# Patient Record
Sex: Female | Born: 1955 | Race: White | Hispanic: No | Marital: Married | Smoking: Current every day smoker
Health system: Southern US, Community
[De-identification: ages and names within clinical notes are randomized; demographics above are authoritative.]

## PROBLEM LIST (undated history)

## (undated) DIAGNOSIS — F419 Anxiety disorder, unspecified: Secondary | ICD-10-CM

## (undated) DIAGNOSIS — F329 Major depressive disorder, single episode, unspecified: Secondary | ICD-10-CM

## (undated) DIAGNOSIS — K219 Gastro-esophageal reflux disease without esophagitis: Secondary | ICD-10-CM

## (undated) DIAGNOSIS — F32A Depression, unspecified: Secondary | ICD-10-CM

## (undated) HISTORY — PX: LEG SURGERY: SHX1003

## (undated) HISTORY — PX: TUBAL LIGATION: SHX77

---

## 2000-10-28 ENCOUNTER — Other Ambulatory Visit: Admission: RE | Admit: 2000-10-28 | Discharge: 2000-10-28 | Payer: Self-pay | Admitting: Family Medicine

## 2004-06-27 ENCOUNTER — Ambulatory Visit: Payer: Self-pay | Admitting: Internal Medicine

## 2004-07-09 ENCOUNTER — Ambulatory Visit: Payer: Self-pay

## 2004-07-24 ENCOUNTER — Emergency Department: Payer: Self-pay | Admitting: Unknown Physician Specialty

## 2004-07-25 ENCOUNTER — Ambulatory Visit: Payer: Self-pay | Admitting: Gastroenterology

## 2004-07-28 ENCOUNTER — Emergency Department: Payer: Self-pay | Admitting: Emergency Medicine

## 2004-07-28 ENCOUNTER — Other Ambulatory Visit: Payer: Self-pay

## 2004-07-31 ENCOUNTER — Ambulatory Visit: Payer: Self-pay | Admitting: Gastroenterology

## 2004-08-07 ENCOUNTER — Ambulatory Visit: Payer: Self-pay

## 2006-01-05 ENCOUNTER — Emergency Department: Payer: Self-pay | Admitting: Internal Medicine

## 2007-02-18 ENCOUNTER — Emergency Department: Payer: Self-pay

## 2007-07-28 ENCOUNTER — Emergency Department: Payer: Self-pay | Admitting: Emergency Medicine

## 2007-08-03 ENCOUNTER — Ambulatory Visit: Payer: Self-pay

## 2007-08-10 ENCOUNTER — Ambulatory Visit: Payer: Self-pay | Admitting: Family Medicine

## 2007-09-08 ENCOUNTER — Emergency Department: Payer: Self-pay | Admitting: Emergency Medicine

## 2009-11-25 ENCOUNTER — Emergency Department: Payer: Self-pay | Admitting: Emergency Medicine

## 2011-07-17 ENCOUNTER — Emergency Department: Payer: Self-pay | Admitting: Emergency Medicine

## 2011-09-15 ENCOUNTER — Emergency Department: Payer: Self-pay | Admitting: *Deleted

## 2011-09-16 LAB — BASIC METABOLIC PANEL
Anion Gap: 10 (ref 7–16)
BUN: 10 mg/dL (ref 7–18)
Calcium, Total: 8.7 mg/dL (ref 8.5–10.1)
Chloride: 105 mmol/L (ref 98–107)
Co2: 24 mmol/L (ref 21–32)
Creatinine: 0.72 mg/dL (ref 0.60–1.30)
EGFR (African American): >60
EGFR (Non-African Amer.): >60
Glucose: 135 mg/dL — ABNORMAL HIGH (ref 65–99)
Osmolality: 279 (ref 275–301)
Potassium: 3.7 mmol/L (ref 3.5–5.1)

## 2011-09-16 LAB — URINALYSIS, COMPLETE
Bacteria: NONE SEEN
Glucose,UR: NEGATIVE mg/dL (ref 0–75)
Ketone: NEGATIVE
Leukocyte Esterase: NEGATIVE
Specific Gravity: 1.003 (ref 1.003–1.030)
Squamous Epithelial: NONE SEEN
WBC UR: <1 /HPF (ref 0–5)

## 2011-09-16 LAB — CBC
HCT: 34.3 % — ABNORMAL LOW (ref 35.0–47.0)
HGB: 11.5 g/dL — ABNORMAL LOW (ref 12.0–16.0)
MCH: 30.3 pg (ref 26.0–34.0)
MCHC: 33.6 g/dL (ref 32.0–36.0)
RDW: 12.6 % (ref 11.5–14.5)
WBC: 10.4 10*3/uL (ref 3.6–11.0)

## 2013-08-13 ENCOUNTER — Emergency Department: Payer: Self-pay | Admitting: Internal Medicine

## 2013-08-13 LAB — COMPREHENSIVE METABOLIC PANEL
ANION GAP: 2 — AB (ref 7–16)
AST: 22 U/L (ref 15–37)
Albumin: 4.6 g/dL (ref 3.4–5.0)
Alkaline Phosphatase: 71 U/L
BUN: 15 mg/dL (ref 7–18)
Bilirubin,Total: 0.4 mg/dL (ref 0.2–1.0)
CO2: 26 mmol/L (ref 21–32)
Calcium, Total: 9.2 mg/dL (ref 8.5–10.1)
Chloride: 111 mmol/L — ABNORMAL HIGH (ref 98–107)
Creatinine: 0.77 mg/dL (ref 0.60–1.30)
GLUCOSE: 86 mg/dL (ref 65–99)
Osmolality: 278 (ref 275–301)
POTASSIUM: 3.9 mmol/L (ref 3.5–5.1)
SGPT (ALT): 20 U/L (ref 12–78)
Sodium: 139 mmol/L (ref 136–145)
TOTAL PROTEIN: 7.8 g/dL (ref 6.4–8.2)

## 2013-08-13 LAB — URINALYSIS, COMPLETE
Bacteria: NONE SEEN
Bilirubin,UR: NEGATIVE
Blood: NEGATIVE
Glucose,UR: NEGATIVE mg/dL (ref 0–75)
KETONE: NEGATIVE
Leukocyte Esterase: NEGATIVE
NITRITE: NEGATIVE
PROTEIN: NEGATIVE
Ph: 6 (ref 4.5–8.0)
Specific Gravity: 1.005 (ref 1.003–1.030)
WBC UR: 1 /HPF (ref 0–5)

## 2013-08-13 LAB — CBC
HCT: 41.8 % (ref 35.0–47.0)
HGB: 13.2 g/dL (ref 12.0–16.0)
MCH: 29.1 pg (ref 26.0–34.0)
MCHC: 31.5 g/dL — ABNORMAL LOW (ref 32.0–36.0)
MCV: 93 fL (ref 80–100)
PLATELETS: 192 10*3/uL (ref 150–440)
RBC: 4.52 10*6/uL (ref 3.80–5.20)
RDW: 12.9 % (ref 11.5–14.5)
WBC: 6.7 10*3/uL (ref 3.6–11.0)

## 2013-08-13 LAB — TROPONIN I: Troponin-I: <0.02 ng/mL

## 2013-08-24 ENCOUNTER — Emergency Department: Payer: Self-pay | Admitting: Emergency Medicine

## 2013-08-24 LAB — CBC WITH DIFFERENTIAL/PLATELET
Basophil #: 0.1 10*3/uL (ref 0.0–0.1)
Basophil %: 0.8 %
EOS PCT: 1.6 %
Eosinophil #: 0.1 10*3/uL (ref 0.0–0.7)
HCT: 41.5 % (ref 35.0–47.0)
HGB: 13.8 g/dL (ref 12.0–16.0)
LYMPHS ABS: 2.2 10*3/uL (ref 1.0–3.6)
LYMPHS PCT: 27 %
MCH: 30.6 pg (ref 26.0–34.0)
MCHC: 33.3 g/dL (ref 32.0–36.0)
MCV: 92 fL (ref 80–100)
MONO ABS: 0.4 x10 3/mm (ref 0.2–0.9)
Monocyte %: 5.4 %
NEUTROS ABS: 5.2 10*3/uL (ref 1.4–6.5)
Neutrophil %: 65.2 %
Platelet: 209 10*3/uL (ref 150–440)
RBC: 4.51 10*6/uL (ref 3.80–5.20)
RDW: 12.7 % (ref 11.5–14.5)
WBC: 8 10*3/uL (ref 3.6–11.0)

## 2013-08-24 LAB — COMPREHENSIVE METABOLIC PANEL
ALT: 18 U/L (ref 12–78)
ANION GAP: 5 — AB (ref 7–16)
AST: 13 U/L — AB (ref 15–37)
Albumin: 4.2 g/dL (ref 3.4–5.0)
Alkaline Phosphatase: 75 U/L
BUN: 16 mg/dL (ref 7–18)
Bilirubin,Total: 0.4 mg/dL (ref 0.2–1.0)
CHLORIDE: 106 mmol/L (ref 98–107)
Calcium, Total: 9.7 mg/dL (ref 8.5–10.1)
Co2: 29 mmol/L (ref 21–32)
Creatinine: 0.78 mg/dL (ref 0.60–1.30)
EGFR (African American): >60
Glucose: 113 mg/dL — ABNORMAL HIGH (ref 65–99)
OSMOLALITY: 281 (ref 275–301)
Potassium: 4.6 mmol/L (ref 3.5–5.1)
Sodium: 140 mmol/L (ref 136–145)
Total Protein: 7.6 g/dL (ref 6.4–8.2)

## 2013-08-24 LAB — URINALYSIS, COMPLETE
BACTERIA: NONE SEEN
BILIRUBIN, UR: NEGATIVE
BLOOD: NEGATIVE
Glucose,UR: NEGATIVE mg/dL (ref 0–75)
KETONE: NEGATIVE
LEUKOCYTE ESTERASE: NEGATIVE
Nitrite: NEGATIVE
Ph: 7 (ref 4.5–8.0)
Protein: NEGATIVE
RBC,UR: NONE SEEN /HPF (ref 0–5)
SPECIFIC GRAVITY: 1.01 (ref 1.003–1.030)
WBC UR: NONE SEEN /HPF (ref 0–5)

## 2014-11-27 ENCOUNTER — Ambulatory Visit
Admission: RE | Admit: 2014-11-27 | Discharge: 2014-11-27 | Disposition: A | Discharge status: Home / Self Care | Payer: Self-pay | Source: Ambulatory Visit | Attending: Oncology | Admitting: Oncology

## 2014-11-27 ENCOUNTER — Ambulatory Visit: Payer: Self-pay | Attending: Oncology

## 2014-11-27 VITALS — BP 95/60 | HR 62 | Temp 97.8°F | Resp 18 | Ht 61.02 in | Wt 108.1 lb

## 2014-11-27 DIAGNOSIS — Z0000 Encounter for general adult medical examination without abnormal findings: Secondary | ICD-10-CM

## 2014-11-27 NOTE — Progress Notes (Signed)
Subjective:     Patient ID: Jillian Greene, female   DOB: 03/28/1956, 59 y.o.   MRN: [id number]  HPI   Review of Systems     Objective:   Physical Exam  Pulmonary/Chest: Right breast exhibits no inverted nipple, no mass, no nipple discharge, no skin change and no tenderness. Left breast exhibits no inverted nipple, no mass, no nipple discharge, no skin change and no tenderness. Breasts are symmetrical.       Assessment:      59 year old patient presents for [facility] clinic visit.   Patient screened, and meets B[facility] eligibility.  Patient does not have insurance, Medicare or Medicaid.  Handout given on Affordable Care Act. Patient is unemployed. Lives with daughter and helps take care of two grandchildren.  CBE unremarkable.  Instructed patient on breast self-exam using teach back method.  Plan:     Sent for bilateral screening mammogram.

## 2014-12-14 NOTE — Progress Notes (Signed)
Letter mailed from [facility] to notify of normal mammogram results.  Patient to return in one year for annual screening.  Copy to [facility]IS. 

## 2016-02-25 ENCOUNTER — Other Ambulatory Visit: Payer: Self-pay | Admitting: *Deleted

## 2016-02-25 ENCOUNTER — Encounter: Payer: Self-pay | Admitting: *Deleted

## 2016-02-25 ENCOUNTER — Ambulatory Visit: Payer: Self-pay | Attending: Oncology | Admitting: *Deleted

## 2016-02-25 ENCOUNTER — Ambulatory Visit
Admission: RE | Admit: 2016-02-25 | Discharge: 2016-02-25 | Disposition: A | Discharge status: Home / Self Care | Payer: Self-pay | Source: Ambulatory Visit | Attending: Oncology | Admitting: Oncology

## 2016-02-25 VITALS — BP 102/66 | HR 63 | Temp 98.3°F | Ht 61.42 in | Wt 116.5 lb

## 2016-02-25 DIAGNOSIS — N6489 Other specified disorders of breast: Secondary | ICD-10-CM

## 2016-02-25 DIAGNOSIS — Z0000 Encounter for general adult medical examination without abnormal findings: Secondary | ICD-10-CM

## 2016-02-25 NOTE — Patient Instructions (Signed)
Gave patient hand-out, Women Staying Healthy, Active and Well from [facility], with education on breast health, pap smears, heart and colon health. 

## 2016-02-25 NOTE — Progress Notes (Signed)
Subjective:     Patient ID: Jillian Greene, female   DOB: [birth year], 60 y.o.   MRN: [id number]  HPI   Review of Systems     Objective:   Physical Exam  Pulmonary/Chest: Right breast exhibits no inverted nipple, no mass, no nipple discharge, no skin change and no tenderness. Left breast exhibits no inverted nipple, no mass, no nipple discharge, no skin change and no tenderness. Breasts are symmetrical.         Assessment:     60 year old White female returns to [facility] for annual screening.  Clinical breast exam unremarkable.  Taught self breast awareness.  Patient has been screened for eligibility.  She does not have any insurance, Medicare or Medicaid.  She also meets financial eligibility.  Hand-out given on the Affordable Care Act.    Plan:     Screening mammogram ordered.  Will follow-up per protocol.

## 2016-03-13 ENCOUNTER — Ambulatory Visit
Admission: RE | Admit: 2016-03-13 | Discharge: 2016-03-13 | Disposition: A | Discharge status: Home / Self Care | Payer: Self-pay | Source: Ambulatory Visit | Attending: Oncology | Admitting: Oncology

## 2016-03-13 DIAGNOSIS — N6489 Other specified disorders of breast: Secondary | ICD-10-CM

## 2016-03-17 ENCOUNTER — Encounter: Payer: Self-pay | Admitting: *Deleted

## 2016-03-17 NOTE — Progress Notes (Signed)
Letter mailed from [facility] to inform patient of her normal mammogram results.  Patient is to follow-up with annual screening in one year.  [facility] to [facility]. 

## 2017-09-09 ENCOUNTER — Encounter: Payer: Self-pay | Admitting: Emergency Medicine

## 2017-09-09 ENCOUNTER — Emergency Department: Payer: Self-pay

## 2017-09-09 ENCOUNTER — Other Ambulatory Visit: Payer: Self-pay

## 2017-09-09 ENCOUNTER — Emergency Department
Admission: EM | Admit: 2017-09-09 | Discharge: 2017-09-09 | Disposition: A | Discharge status: Home / Self Care | Payer: Self-pay | Attending: Emergency Medicine | Admitting: Emergency Medicine

## 2017-09-09 DIAGNOSIS — Y929 Unspecified place or not applicable: Secondary | ICD-10-CM | POA: Insufficient documentation

## 2017-09-09 DIAGNOSIS — R51 Headache: Secondary | ICD-10-CM | POA: Insufficient documentation

## 2017-09-09 DIAGNOSIS — F0781 Postconcussional syndrome: Secondary | ICD-10-CM | POA: Insufficient documentation

## 2017-09-09 DIAGNOSIS — W1830XA Fall on same level, unspecified, initial encounter: Secondary | ICD-10-CM | POA: Insufficient documentation

## 2017-09-09 DIAGNOSIS — Y939 Activity, unspecified: Secondary | ICD-10-CM | POA: Insufficient documentation

## 2017-09-09 DIAGNOSIS — Y999 Unspecified external cause status: Secondary | ICD-10-CM | POA: Insufficient documentation

## 2017-09-09 DIAGNOSIS — F17210 Nicotine dependence, cigarettes, uncomplicated: Secondary | ICD-10-CM | POA: Insufficient documentation

## 2017-09-09 LAB — URINALYSIS, COMPLETE (UACMP) WITH MICROSCOPIC
BILIRUBIN URINE: NEGATIVE
Glucose, UA: NEGATIVE mg/dL
HGB URINE DIPSTICK: NEGATIVE
KETONES UR: NEGATIVE mg/dL
LEUKOCYTES UA: NEGATIVE
NITRITE: NEGATIVE
PROTEIN: NEGATIVE mg/dL
Specific Gravity, Urine: 1.005 (ref 1.005–1.030)
pH: 6 (ref 5.0–8.0)

## 2017-09-09 LAB — CBC
HCT: 39.2 % (ref 35.0–47.0)
HEMOGLOBIN: 13.6 g/dL (ref 12.0–16.0)
MCH: 31.4 pg (ref 26.0–34.0)
MCHC: 34.6 g/dL (ref 32.0–36.0)
MCV: 90.7 fL (ref 80.0–100.0)
Platelets: 215 10*3/uL (ref 150–440)
RBC: 4.33 MIL/uL (ref 3.80–5.20)
RDW: 13.4 % (ref 11.5–14.5)
WBC: 7.2 10*3/uL (ref 3.6–11.0)

## 2017-09-09 LAB — BASIC METABOLIC PANEL
ANION GAP: 6 (ref 5–15)
BUN: 18 mg/dL (ref 6–20)
CHLORIDE: 106 mmol/L (ref 101–111)
CO2: 28 mmol/L (ref 22–32)
Calcium: 9.3 mg/dL (ref 8.9–10.3)
Creatinine, Ser: 0.85 mg/dL (ref 0.44–1.00)
GFR calc Af Amer: >60 mL/min (ref >60)
GFR calc non Af Amer: >60 mL/min (ref >60)
GLUCOSE: 88 mg/dL (ref 65–99)
POTASSIUM: 4.2 mmol/L (ref 3.5–5.1)
Sodium: 140 mmol/L (ref 135–145)

## 2017-09-09 LAB — TROPONIN I: Troponin I: <0.03 ng/mL (ref <0.03)

## 2017-09-09 MED ORDER — MECLIZINE HCL 25 MG PO TABS
25.0000 mg | ORAL_TABLET | Freq: Once | ORAL | Status: AC
  Administered 2017-09-09: 25 mg via ORAL
  Filled 2017-09-09: qty 1

## 2017-09-09 MED ORDER — MECLIZINE HCL 25 MG PO TABS: 25.0000 mg | ORAL_TABLET | Freq: Three times a day (TID) | ORAL | 0 refills | Status: DC | PRN

## 2017-09-09 MED ORDER — ONDANSETRON HCL 4 MG PO TABS: 4.0000 mg | ORAL_TABLET | Freq: Three times a day (TID) | ORAL | 0 refills | Status: DC | PRN

## 2017-09-09 NOTE — ED Triage Notes (Addendum)
Pt to ED via POV with c/o head pain and dizziness xfew days. States she had mechanical fall x3wks ago and since has been having worsening symptoms. PT denies any anticoag use. PT A&OX4, speech clear. vss

## 2017-09-09 NOTE — ED Notes (Signed)
Pt states that she fell about 3 weeks ago on freshly mopped kitchen floor. Hit left side of head and eye was swollen and black. Past few days she has been feeling dizzy and lightheaded. Also reports HA. Husband at bedside. Pt alert and oriented x 4.

## 2017-09-09 NOTE — ED Provider Notes (Signed)
[facility] Emergency Department Provider Note  ____________________________________________   I have reviewed the triage vital signs and the nursing notes. Where available I have reviewed prior notes and, if possible and indicated, outside hospital notes.    HISTORY  Chief Complaint Fall    HPI Jillian Greene Current is a 62 y.o. female who is not on blood thinners.  She fell 3 weeks ago hit her head pretty hard she states she did not pass out but she has had admitted mild headaches and intermittent episodes of dizziness difficulty focusing since the fall.  She had bruising above the left eye which is gone.  She states that she has no trouble with vision no focal neurologic deficit no severe headaches, but she states that she does feel lightheaded and occasionally has true vertigo.  All of her symptoms started exactly with the fall.  She did not fall for any reasons related syncope.  She has no chest pain shortness of breath or other complaints.   History reviewed. No pertinent past medical history.  There are no active problems to display for this patient.   History reviewed. No pertinent surgical history.  Prior to Admission medications   Not on File    Allergies Patient has no known allergies.  Family History  Problem Relation Age of Onset  . Breast cancer Maternal Aunt     Social History Social History   Tobacco Use  . Smoking status: Current Every Day Smoker    Packs/day: 0.50    Types: Cigarettes  . Smokeless tobacco: Never Used  Substance Use Topics  . Alcohol use: Never    Frequency: Never  . Drug use: Never    Review of SystemsConstitutional: No fever/chills Eyes: No visual changes. ENT: No sore throat. No stiff neck no neck pain Cardiovascular: Denies chest pain. Respiratory: Denies shortness of breath. Gastrointestinal:   no vomiting.  No diarrhea.  No constipation. Genitourinary: Negative for dysuria. Musculoskeletal: Negative  lower extremity swelling Skin: Negative for rash. Neurological: Negative for severe headaches, focal weakness or numbness.   ____________________________________________   PHYSICAL EXAM:  VITAL SIGNS: ED Triage Vitals  Enc Vitals Group     BP 09/09/17 1843 132/76     Pulse Rate 09/09/17 1843 74     Resp 09/09/17 1843 18     Temp 09/09/17 1843 99.1 F (37.3 C)     Temp Source 09/09/17 1843 Oral     SpO2 09/09/17 1843 99 %     Weight 09/09/17 1843 116 lb (52.6 kg)     Height 09/09/17 1843 5\' 2"  (1.575 m)     Head Circumference --      Peak Flow --      Pain Score 09/09/17 1849 3     Pain Loc --      Pain Edu? --      Excl. in [place]? --     Constitutional: Alert and oriented. Well appearing and in no acute distress. Eyes: Conjunctivae are normal Head: Atraumatic HEENT: No congestion/rhinnorhea. Mucous membranes are moist.  Oropharynx non-erythematous Neck:   Nontender with no meningismus, no masses, no stridor Cardiovascular: Normal rate, regular rhythm. Grossly normal heart sounds.  Good peripheral circulation. Respiratory: Normal respiratory effort.  No retractions. Lungs CTAB. Abdominal: Soft and nontender. No distention. No guarding no rebound Back:  There is no focal tenderness or step off.  there is no midline tenderness there are no lesions noted. there is no CVA tenderness Musculoskeletal: No lower extremity  tenderness, no upper extremity tenderness. No joint effusions, no DVT signs strong distal pulses no edema Neurologic: Cranial nerves II through XII are grossly intact 5 out of 5 strength bilateral upper and lower extremity. Finger to nose within normal limits heel to shin within normal limits, speech is normal with no word finding difficulty or dysarthria, reflexes symmetric, pupils are equally round and reactive to light, there is no pronator drift, sensation is normal, vision is intact to confrontation, gait is deferred, there is no nystagmus, normal neurologic  exam Skin:  Skin is warm, dry and intact. No rash noted. Psychiatric: Mood and affect are normal. Speech and behavior are normal.  ____________________________________________   LABS (all labs ordered are listed, but only abnormal results are displayed)  Labs Reviewed  URINALYSIS, COMPLETE (UACMP) WITH MICROSCOPIC - Abnormal; Notable for the following components:      Result Value   Color, Urine STRAW (*)    APPearance CLEAR (*)    Bacteria, UA RARE (*)    Squamous Epithelial / LPF 0-5 (*)    All other components within normal limits  BASIC METABOLIC PANEL  CBC  TROPONIN I  CBG MONITORING, ED    Pertinent labs  results that were available during my care of the patient were reviewed by me and considered in my medical decision making (see chart for details). ____________________________________________  EKG  I personally interpreted any EKGs ordered by me or triage Normal sinus rhythm rate 72 bpm no acute ST elevation or depression normal axis unremarkable EKG ____________________________________________  RADIOLOGY  Pertinent labs & imaging results that were available during my care of the patient were reviewed by me and considered in my medical decision making (see chart for details). If possible, patient and/or family made aware of any abnormal findings.  Ct Head Wo Contrast  Result Date: 09/09/2017 EXAM: CT HEAD WITHOUT CONTRAST TECHNIQUE: Contiguous axial images were obtained from the base of the skull through the vertex without intravenous contrast. COMPARISON:  None. FINDINGS: Brain: No evidence of acute infarction, hemorrhage, hydrocephalus, extra-axial collection or mass lesion/mass effect. Vascular: No hyperdense vessel or unexpected calcification. Skull: Normal. Negative for fracture or focal lesion. Sinuses/Orbits: Visualize globes and orbits are unremarkable. Visualized sinuses and mastoid air cells are clear. Other: None. IMPRESSION: Normal unenhanced CT scan of the  brain. Electronically Signed   By: Lajean Manes M.D.   On: 09/09/2017 19:31   ____________________________________________    PROCEDURES  Procedure(s) performed: None  Procedures  Critical Care performed: None  ____________________________________________   INITIAL IMPRESSION / ASSESSMENT AND PLAN / ED COURSE  Pertinent labs & imaging results that were available during my care of the patient were reviewed by me and considered in my medical decision making (see chart for details).  Here with signs and symptoms of postconcussive syndrome.  She has been having vertigo and lightheadedness, occasional mild headaches, and difficulty focusing since she fell and hit her head 3 weeks ago.  She slipped on something wet.  It was not a syncopal fall.  She has NIH stroke scale of 0 I do not think this represents CVA.  We will treat her symptomatically and advise close outpatient follow-up with neurology.    ____________________________________________   FINAL CLINICAL IMPRESSION(S) / ED DIAGNOSES  Final diagnoses:  None      This chart was dictated using voice recognition software.  Despite best efforts to proofread,  errors can occur which can change meaning.      Schuyler Amor, MD  09/09/17 2017  

## 2017-09-09 NOTE — ED Notes (Signed)
Spoke with MD Joni Fears regarding pt presentation, verbal orders for CT head given

## 2018-05-30 ENCOUNTER — Emergency Department: Payer: Self-pay

## 2018-05-30 ENCOUNTER — Emergency Department
Admission: EM | Admit: 2018-05-30 | Discharge: 2018-05-30 | Disposition: A | Discharge status: Home / Self Care | Payer: Self-pay | Attending: Emergency Medicine | Admitting: Emergency Medicine

## 2018-05-30 ENCOUNTER — Encounter: Payer: Self-pay | Admitting: Emergency Medicine

## 2018-05-30 ENCOUNTER — Other Ambulatory Visit: Payer: Self-pay

## 2018-05-30 DIAGNOSIS — Z79899 Other long term (current) drug therapy: Secondary | ICD-10-CM | POA: Insufficient documentation

## 2018-05-30 DIAGNOSIS — F329 Major depressive disorder, single episode, unspecified: Secondary | ICD-10-CM | POA: Insufficient documentation

## 2018-05-30 DIAGNOSIS — F419 Anxiety disorder, unspecified: Secondary | ICD-10-CM | POA: Insufficient documentation

## 2018-05-30 DIAGNOSIS — M5410 Radiculopathy, site unspecified: Secondary | ICD-10-CM | POA: Insufficient documentation

## 2018-05-30 DIAGNOSIS — M79602 Pain in left arm: Secondary | ICD-10-CM | POA: Insufficient documentation

## 2018-05-30 DIAGNOSIS — F17210 Nicotine dependence, cigarettes, uncomplicated: Secondary | ICD-10-CM | POA: Insufficient documentation

## 2018-05-30 HISTORY — DX: Gastro-esophageal reflux disease without esophagitis: K21.9

## 2018-05-30 HISTORY — DX: Major depressive disorder, single episode, unspecified: F32.9

## 2018-05-30 HISTORY — DX: Anxiety disorder, unspecified: F41.9

## 2018-05-30 HISTORY — DX: Depression, unspecified: F32.A

## 2018-05-30 LAB — BASIC METABOLIC PANEL
Anion gap: 7 (ref 5–15)
BUN: 17 mg/dL (ref 8–23)
CO2: 23 mmol/L (ref 22–32)
Calcium: 9 mg/dL (ref 8.9–10.3)
Chloride: 108 mmol/L (ref 98–111)
Creatinine, Ser: 0.68 mg/dL (ref 0.44–1.00)
GFR calc Af Amer: >60 mL/min (ref >60)
GFR calc non Af Amer: >60 mL/min (ref >60)
Glucose, Bld: 79 mg/dL (ref 70–99)
Potassium: 4.5 mmol/L (ref 3.5–5.1)
Sodium: 138 mmol/L (ref 135–145)

## 2018-05-30 LAB — CBC
HEMATOCRIT: 39.6 % (ref 36.0–46.0)
Hemoglobin: 12.8 g/dL (ref 12.0–15.0)
MCH: 30.3 pg (ref 26.0–34.0)
MCHC: 32.3 g/dL (ref 30.0–36.0)
MCV: 93.6 fL (ref 80.0–100.0)
NRBC: 0 % (ref 0.0–0.2)
Platelets: 240 10*3/uL (ref 150–400)
RBC: 4.23 MIL/uL (ref 3.87–5.11)
RDW: 12.9 % (ref 11.5–15.5)
WBC: 7.4 10*3/uL (ref 4.0–10.5)

## 2018-05-30 LAB — TROPONIN I: Troponin I: <0.03 ng/mL (ref <0.03)

## 2018-05-30 MED ORDER — NAPROXEN 500 MG PO TABS: 500.0000 mg | ORAL_TABLET | Freq: Two times a day (BID) | ORAL | 2 refills | Status: AC

## 2018-05-30 MED ORDER — PREDNISONE 50 MG PO TABS: 50.0000 mg | ORAL_TABLET | Freq: Every day | ORAL | 0 refills | Status: AC

## 2018-05-30 MED ORDER — KETOROLAC TROMETHAMINE 30 MG/ML IJ SOLN
30.0000 mg | Freq: Once | INTRAMUSCULAR | Status: AC
  Administered 2018-05-30: 30 mg via INTRAMUSCULAR
  Filled 2018-05-30: qty 1

## 2018-05-30 NOTE — ED Notes (Signed)
E-signature not working at time of D/C. Pt and family verbalized understanding of instructions with no further questions at this time. Pt in NAD at time of D/C.

## 2018-05-30 NOTE — ED Triage Notes (Signed)
Pt to ED via POV stating that she has been having left arm pain x 1 week. Pt states that today the pain seems to have gotten worse and she is now having pain in her left arm and shoulder. Pt states that this started while she was in church. Pt states that she is a little short of breath. Pt denies N/V. Pt is in NAD at this time.

## 2018-05-30 NOTE — ED Notes (Signed)
Patient transported to X-ray 

## 2018-05-30 NOTE — ED Provider Notes (Signed)
[facility] Emergency Department Provider Note   ____________________________________________    I have reviewed the triage vital signs and the nursing notes.   HISTORY  Chief Complaint Arm Pain     HPI Jillian Greene is a 62 y.o. female who presents with complaints of left arm pain.  Patient notes that the lateral aspect of her left arm has been hurting for "weeks.  She reports it is most prominent near the elbow and proximal forearm.  No erythema, no trauma to the area, no bruising.  Occasionally she has pain radiate all the way down her arm to her fingertips.  She denies numbness or tingling.  No fevers or chills.  No history of DVT.  No significant swelling.  No chest pain.  Denies neck pain he is never had this before, she has not taken anything for this.   Past Medical History:  Diagnosis Date  . Anxiety and depression   . GERD (gastroesophageal reflux disease)     There are no active problems to display for this patient.   Past Surgical History:  Procedure Laterality Date  . LEG SURGERY Right   . TUBAL LIGATION      Prior to Admission medications   Medication Sig Start Date End Date Taking? Authorizing Provider  lansoprazole (PREVACID) 30 MG capsule Take 30 mg by mouth daily at 12 noon.   Yes [provider]  sertraline (ZOLOFT) 100 MG tablet Take 100 mg by mouth at bedtime.   Yes [provider]  naproxen (NAPROSYN) 500 MG tablet Take 1 tablet (500 mg total) by mouth 2 (two) times daily with a meal. 05/30/18   Lavonia Drafts, MD  predniSONE (DELTASONE) 50 MG tablet Take 1 tablet (50 mg total) by mouth daily with breakfast. 05/30/18   Lavonia Drafts, MD     Allergies Codeine and Cyclobenzaprine  Family History  Problem Relation Age of Onset  . Breast cancer Maternal Aunt     Social History Social History   Tobacco Use  . Smoking status: Current Every Day Smoker    Packs/day: 0.50    Types: Cigarettes  .  Smokeless tobacco: Never Used  Substance Use Topics  . Alcohol use: Never    Frequency: Never  . Drug use: Never    Review of Systems  Constitutional: No fever/chills Eyes: No visual changes.  ENT: No neck pain Cardiovascular: Denies chest pain. Respiratory: Denies shortness of breath. Gastrointestinal: No abdominal pain.  No nausea, no vomiting.   Genitourinary: Negative for dysuria. Musculoskeletal: As above Skin: As above Neurological: Negative for tingling or weakness   ____________________________________________   PHYSICAL EXAM:  VITAL SIGNS: ED Triage Vitals  Enc Vitals Group     BP 05/30/18 1259 (!) 159/78     Pulse Rate 05/30/18 1259 (!) 54     Resp 05/30/18 1259 16     Temp 05/30/18 1259 98.4 F (36.9 C)     Temp src --      SpO2 05/30/18 1259 100 %     Weight 05/30/18 1301 54.4 kg (120 lb)     Height 05/30/18 1301 1.575 m (5\' 2" )     Head Circumference --      Peak Flow --      Pain Score 05/30/18 1301 7     Pain Loc --      Pain Edu? --      Excl. in [place]? --     Constitutional: Alert and oriented.  Eyes: Conjunctivae are normal.   Mouth/Throat: Mucous membranes are moist.   Neck:  Painless ROM, no vertebral tenderness to palpation Cardiovascular: Normal rate, regular rhythm. Grossly normal heart sounds.  Good peripheral circulation. Respiratory: Normal respiratory effort.  No retractions. Lungs CTAB.   Musculoskeletal: Left upper extremity: Overall reassuring exam, no significant swelling, no pallor, well-perfused, no bruising or evidence of trauma.  Mild tenderness overlying the proximal lateral left forearm but no palpable mass.  Warm and well perfused Neurologic:  Normal speech and language. No gross focal neurologic deficits are appreciated.  Skin:  Skin is warm, dry and intact. No rash noted. Psychiatric: Mood and affect are normal. Speech and behavior are normal.  ____________________________________________   LABS (all labs ordered are  listed, but only abnormal results are displayed)  Labs Reviewed  BASIC METABOLIC PANEL  CBC  TROPONIN I   ____________________________________________  EKG  ED ECG REPORT I, Lavonia Drafts, the attending physician, personally viewed and interpreted this ECG.  Date: 05/30/2018  Rhythm: normal sinus rhythm QRS Axis: normal Intervals: normal ST/T Wave abnormalities: normal Narrative Interpretation: no evidence of acute ischemia  ____________________________________________  RADIOLOGY  Chest x-ray demonstrates no acute illness Negative for DVT ____________________________________________   PROCEDURES  Procedure(s) performed: No  Procedures   Critical Care performed: No ____________________________________________   INITIAL IMPRESSION / ASSESSMENT AND PLAN / ED COURSE  Pertinent labs & imaging results that were available during my care of the patient were reviewed by me and considered in my medical decision making (see chart for details).  Patient presents with left upper extremity pain as described above, strongly suspect musculoskeletal pain versus possible radiculopathy.  Will treat with IM Toradol, check ultrasound to rule out blood clot anticipate outpatient supportive care  _________________________   FINAL CLINICAL IMPRESSION(S) / ED DIAGNOSES  Final diagnoses:  Left arm pain  Radiculopathy, unspecified spinal region        Note:  This document was prepared using Dragon voice recognition software and may include unintentional dictation errors.   Lavonia Drafts, MD 05/30/18 [id number]

## 2019-01-26 ENCOUNTER — Telehealth: Payer: Self-pay | Admitting: *Deleted

## 2019-01-26 DIAGNOSIS — Z87891 Personal history of nicotine dependence: Secondary | ICD-10-CM

## 2019-01-26 DIAGNOSIS — Z122 Encounter for screening for malignant neoplasm of respiratory organs: Secondary | ICD-10-CM

## 2019-01-26 NOTE — Telephone Encounter (Signed)
Received referral for initial lung cancer screening scan. Contacted patient and obtained smoking history,(current, 45 pack year) as well as answering questions related to screening process. Patient denies signs of lung cancer such as weight loss or hemoptysis. Patient denies comorbidity that would prevent curative treatment if lung cancer were found. Patient is scheduled for shared decision making visit and CT scan on (date tbd related to patient preference).

## 2019-02-14 ENCOUNTER — Inpatient Hospital Stay: Payer: BC Managed Care – PPO | Attending: Nurse Practitioner | Admitting: Nurse Practitioner

## 2019-02-14 ENCOUNTER — Ambulatory Visit: Payer: BC Managed Care – PPO

## 2019-02-14 ENCOUNTER — Encounter: Payer: Self-pay | Admitting: Nurse Practitioner

## 2019-02-15 ENCOUNTER — Telehealth: Payer: Self-pay | Admitting: *Deleted

## 2019-02-15 NOTE — Telephone Encounter (Signed)
Received referral for low dose lung cancer screening CT scan. Message left at phone number listed in EMR for patient to call me back to facilitate scheduling scan.  

## 2019-02-28 ENCOUNTER — Ambulatory Visit: Admission: RE | Admit: 2019-02-28 | Payer: BC Managed Care – PPO | Source: Ambulatory Visit

## 2019-02-28 ENCOUNTER — Inpatient Hospital Stay (HOSPITAL_BASED_OUTPATIENT_CLINIC_OR_DEPARTMENT_OTHER): Payer: BC Managed Care – PPO | Admitting: Oncology

## 2019-02-28 DIAGNOSIS — Z87891 Personal history of nicotine dependence: Secondary | ICD-10-CM

## 2019-02-28 NOTE — Progress Notes (Signed)
Virtual Visit via Video Note  I connected with Jillian Greene on 02/28/19 at 10:30 AM EDT by a video enabled telemedicine application and verified that I am speaking with the correct person using two identifiers.  Location: Patient: Home Provider: Office   I discussed the limitations of evaluation and management by telemedicine and the availability of in person appointments. The patient expressed understanding and agreed to proceed.  I discussed the assessment and treatment plan with the patient. The patient was provided an opportunity to ask questions and all were answered. The patient agreed with the plan and demonstrated an understanding of the instructions.   The patient was advised to call back or seek an in-person evaluation if the symptoms worsen or if the condition fails to improve as anticipated.   In accordance with CMS guidelines, patient has met eligibility criteria including age, absence of signs or symptoms of lung cancer.  Social History   Tobacco Use  . Smoking status: Current Every Day Smoker    Packs/day: 1.00    Years: 45.00    Pack years: 45.00    Types: Cigarettes  . Smokeless tobacco: Never Used  . Tobacco comment: .5ppd currently  Substance Use Topics  . Alcohol use: Never    Frequency: Never  . Drug use: Never      A shared decision-making session was conducted prior to the performance of CT scan. This includes one or more decision aids, includes benefits and harms of screening, follow-up diagnostic testing, over-diagnosis, false positive rate, and total radiation exposure.   Counseling on the importance of adherence to annual lung cancer LDCT screening, impact of co-morbidities, and ability or willingness to undergo diagnosis and treatment is imperative for compliance of the program.   Counseling on the importance of continued smoking cessation for former smokers; the importance of smoking cessation for current smokers, and information about tobacco  cessation interventions have been given to patient including [place] and [facility] quit [place] programs.   Written order for lung cancer screening with LDCT has been given to the patient and any and all questions have been answered to the best of my abilities.    Yearly follow up will be coordinated by Burgess Estelle, Thoracic Navigator.  I provided 15 minutes of face-to-face video visit time during this encounter, and > 50% was spent counseling as documented under my assessment & plan.   Jacquelin Hawking, NP

## 2019-03-07 ENCOUNTER — Ambulatory Visit
Admission: RE | Admit: 2019-03-07 | Discharge: 2019-03-07 | Disposition: A | Discharge status: Home / Self Care | Payer: BC Managed Care – PPO | Source: Ambulatory Visit | Attending: Nurse Practitioner | Admitting: Nurse Practitioner

## 2019-03-07 ENCOUNTER — Other Ambulatory Visit: Payer: Self-pay

## 2019-03-07 DIAGNOSIS — Z122 Encounter for screening for malignant neoplasm of respiratory organs: Secondary | ICD-10-CM | POA: Insufficient documentation

## 2019-03-07 DIAGNOSIS — Z87891 Personal history of nicotine dependence: Secondary | ICD-10-CM | POA: Insufficient documentation

## 2019-03-09 ENCOUNTER — Encounter: Payer: Self-pay | Admitting: *Deleted

## 2019-03-14 ENCOUNTER — Other Ambulatory Visit: Payer: Self-pay | Admitting: Infectious Diseases

## 2019-03-14 DIAGNOSIS — Z1231 Encounter for screening mammogram for malignant neoplasm of breast: Secondary | ICD-10-CM

## 2019-06-06 ENCOUNTER — Ambulatory Visit
Admission: RE | Admit: 2019-06-06 | Discharge: 2019-06-06 | Disposition: A | Discharge status: Home / Self Care | Payer: BC Managed Care – PPO | Source: Ambulatory Visit | Attending: Infectious Diseases | Admitting: Infectious Diseases

## 2019-06-06 DIAGNOSIS — Z1231 Encounter for screening mammogram for malignant neoplasm of breast: Secondary | ICD-10-CM | POA: Diagnosis present

## 2020-02-24 ENCOUNTER — Telehealth: Payer: Self-pay | Admitting: *Deleted

## 2020-02-24 NOTE — Telephone Encounter (Signed)
(  02/24/2020) Pt notified that lung cancer screening imaging is due currently or in the near future. Verified smoking history (Current Smoker, 0.5 ppd ). Tentative appt on 03/12/20 @ 10:30 am SRW

## 2020-02-26 ENCOUNTER — Other Ambulatory Visit: Payer: Self-pay | Admitting: *Deleted

## 2020-02-26 DIAGNOSIS — Z87891 Personal history of nicotine dependence: Secondary | ICD-10-CM

## 2020-02-26 DIAGNOSIS — Z122 Encounter for screening for malignant neoplasm of respiratory organs: Secondary | ICD-10-CM

## 2020-02-26 NOTE — Progress Notes (Signed)
Current smoker, 45.5. pack year

## 2020-03-12 ENCOUNTER — Ambulatory Visit: Payer: BC Managed Care – PPO

## 2020-03-19 ENCOUNTER — Ambulatory Visit
Admission: RE | Admit: 2020-03-19 | Discharge: 2020-03-19 | Disposition: A | Discharge status: Home / Self Care | Payer: BC Managed Care – PPO | Source: Ambulatory Visit | Attending: Oncology | Admitting: Oncology

## 2020-03-19 ENCOUNTER — Other Ambulatory Visit: Payer: Self-pay

## 2020-03-19 DIAGNOSIS — Z122 Encounter for screening for malignant neoplasm of respiratory organs: Secondary | ICD-10-CM | POA: Diagnosis present

## 2020-03-19 DIAGNOSIS — Z87891 Personal history of nicotine dependence: Secondary | ICD-10-CM | POA: Insufficient documentation

## 2020-03-21 ENCOUNTER — Encounter: Payer: Self-pay | Admitting: *Deleted

## 2020-12-21 ENCOUNTER — Other Ambulatory Visit: Payer: Self-pay

## 2020-12-21 ENCOUNTER — Emergency Department
Admission: EM | Admit: 2020-12-21 | Discharge: 2020-12-21 | Disposition: A | Discharge status: Home / Self Care | Payer: Self-pay | Attending: Student in an Organized Health Care Education/Training Program | Admitting: Student in an Organized Health Care Education/Training Program

## 2020-12-21 ENCOUNTER — Emergency Department: Payer: Self-pay

## 2020-12-21 DIAGNOSIS — R42 Dizziness and giddiness: Secondary | ICD-10-CM | POA: Insufficient documentation

## 2020-12-21 DIAGNOSIS — R197 Diarrhea, unspecified: Secondary | ICD-10-CM | POA: Insufficient documentation

## 2020-12-21 DIAGNOSIS — R110 Nausea: Secondary | ICD-10-CM | POA: Insufficient documentation

## 2020-12-21 DIAGNOSIS — F17210 Nicotine dependence, cigarettes, uncomplicated: Secondary | ICD-10-CM | POA: Insufficient documentation

## 2020-12-21 DIAGNOSIS — Z20822 Contact with and (suspected) exposure to covid-19: Secondary | ICD-10-CM | POA: Insufficient documentation

## 2020-12-21 LAB — RESP PANEL BY RT-PCR (FLU A&B, COVID) ARPGX2
Influenza A by PCR: NEGATIVE
Influenza B by PCR: NEGATIVE
SARS Coronavirus 2 by RT PCR: NEGATIVE

## 2020-12-21 LAB — URINALYSIS, COMPLETE (UACMP) WITH MICROSCOPIC
Bacteria, UA: NONE SEEN
Bilirubin Urine: NEGATIVE
Glucose, UA: NEGATIVE mg/dL
Hgb urine dipstick: NEGATIVE
Ketones, ur: NEGATIVE mg/dL
Leukocytes,Ua: NEGATIVE
Nitrite: NEGATIVE
Protein, ur: NEGATIVE mg/dL
Specific Gravity, Urine: 1.015 (ref 1.005–1.030)
pH: 5 (ref 5.0–8.0)

## 2020-12-21 LAB — CBC
HCT: 39.4 % (ref 36.0–46.0)
Hemoglobin: 13.4 g/dL (ref 12.0–15.0)
MCH: 31.5 pg (ref 26.0–34.0)
MCHC: 34 g/dL (ref 30.0–36.0)
MCV: 92.5 fL (ref 80.0–100.0)
Platelets: 231 10*3/uL (ref 150–400)
RBC: 4.26 MIL/uL (ref 3.87–5.11)
RDW: 12.7 % (ref 11.5–15.5)
WBC: 6.3 10*3/uL (ref 4.0–10.5)
nRBC: 0 % (ref 0.0–0.2)

## 2020-12-21 LAB — TROPONIN I (HIGH SENSITIVITY): Troponin I (High Sensitivity): 3 ng/L (ref <18)

## 2020-12-21 LAB — BASIC METABOLIC PANEL
Anion gap: 7 (ref 5–15)
BUN: 18 mg/dL (ref 8–23)
CO2: 22 mmol/L (ref 22–32)
Calcium: 9.5 mg/dL (ref 8.9–10.3)
Chloride: 108 mmol/L (ref 98–111)
Creatinine, Ser: 0.85 mg/dL (ref 0.44–1.00)
GFR, Estimated: >60 mL/min (ref >60)
Glucose, Bld: 94 mg/dL (ref 70–99)
Potassium: 4.3 mmol/L (ref 3.5–5.1)
Sodium: 137 mmol/L (ref 135–145)

## 2020-12-21 LAB — HEPATIC FUNCTION PANEL
ALT: 13 U/L (ref 0–44)
AST: 14 U/L — ABNORMAL LOW (ref 15–41)
Albumin: 3.8 g/dL (ref 3.5–5.0)
Alkaline Phosphatase: 63 U/L (ref 38–126)
Bilirubin, Direct: <0.1 mg/dL (ref 0.0–0.2)
Total Bilirubin: 0.7 mg/dL (ref 0.3–1.2)
Total Protein: 6.9 g/dL (ref 6.5–8.1)

## 2020-12-21 MED ORDER — SODIUM CHLORIDE 0.9 % IV BOLUS
1000.0000 mL | Freq: Once | INTRAVENOUS | Status: AC
  Administered 2020-12-21: 1000 mL via INTRAVENOUS

## 2020-12-21 NOTE — ED Triage Notes (Addendum)
Pt c/o dizziness/lightheadedness w/ standing x2 days. Pt reports diarhea x5 yesterday and rt hand and bilateral arm pain- denies injury. Pt denies CP, SOB, n/v. Pt is AOX4, speech is clear, upper and lower extremities equally strong. Pt denies visual disturbances. Pt ambulatory.

## 2020-12-21 NOTE — ED Provider Notes (Signed)
[facility] Emergency Department Provider Note    Event Date/Time   First MD Initiated Contact with Patient 12/21/20 1436     (approximate)  I have reviewed the triage vital signs and the nursing notes.   HISTORY  Chief Complaint Dizziness    HPI Jillian Greene is a 65 y.o. female below listed past medical history presents to the ER for evaluation of to 3 days of diarrheal illness nausea decreased p.o. intake with associated dizziness that she states occurs most frequently she stands up quickly.  Denies any headache.  States that she did have some achiness in bilateral arms that is since resolved.  Had some mild intermittent chest pain denies any chest pain or discomfort at this time.  No cough or congestion.  Denies any abdominal pain.  No dysuria.  Past Medical History:  Diagnosis Date   Anxiety and depression    GERD (gastroesophageal reflux disease)    Family History  Problem Relation Age of Onset   Breast cancer Maternal Aunt    Past Surgical History:  Procedure Laterality Date   LEG SURGERY Right    TUBAL LIGATION     There are no problems to display for this patient.     Prior to Admission medications   Medication Sig Start Date End Date Taking? Authorizing Provider  lansoprazole (PREVACID) 30 MG capsule Take 30 mg by mouth daily at 12 noon.    [provider]  naproxen (NAPROSYN) 500 MG tablet Take 1 tablet (500 mg total) by mouth 2 (two) times daily with a meal. 05/30/18   Lavonia Drafts, MD  predniSONE (DELTASONE) 50 MG tablet Take 1 tablet (50 mg total) by mouth daily with breakfast. 05/30/18   Lavonia Drafts, MD  sertraline (ZOLOFT) 100 MG tablet Take 100 mg by mouth at bedtime.    [provider]    Allergies Codeine and Cyclobenzaprine    Social History Social History   Tobacco Use   Smoking status: Every Day    Packs/day: 1.00    Years: 45.00    Pack years: 45.00    Types: Cigarettes    Smokeless tobacco: Never   Tobacco comments:    .5ppd currently  Substance Use Topics   Alcohol use: Never   Drug use: Never    Review of Systems Patient denies headaches, rhinorrhea, blurry vision, numbness, shortness of breath, chest pain, edema, cough, abdominal pain, nausea, vomiting, diarrhea, dysuria, fevers, rashes or hallucinations unless otherwise stated above in HPI. ____________________________________________   PHYSICAL EXAM:  VITAL SIGNS: Vitals:   12/21/20 1345  BP: 118/72  Pulse: 63  Resp: 16  Temp: 98.4 F (36.9 C)  SpO2: 98%    Constitutional: Alert and oriented.  Eyes: Conjunctivae are normal.  Head: Atraumatic. Nose: No congestion/rhinnorhea. Mouth/Throat: Mucous membranes are moist.   Neck: No stridor. Painless ROM.  Cardiovascular: Normal rate, regular rhythm. Grossly normal heart sounds.  Good peripheral circulation. Respiratory: Normal respiratory effort.  No retractions. Lungs CTAB. Gastrointestinal: Soft and nontender. No distention. No abdominal bruits. No CVA tenderness. Genitourinary:  Musculoskeletal: No lower extremity tenderness nor edema.  No joint effusions. Neurologic:  CN- intact.  No facial droop, Normal FNF.  Normal heel to shin.  Sensation intact bilaterally. Normal speech and language. No gross focal neurologic deficits are appreciated. No gait instability. Skin:  Skin is warm, dry and intact. No rash noted. Psychiatric: Mood and affect are normal. Speech and behavior are normal.  ____________________________________________   LABS (  all labs ordered are listed, but only abnormal results are displayed)  Results for orders placed or performed during the hospital encounter of 12/21/20 (from the past 24 hour(s))  Basic metabolic panel     Status: None   Collection Time: 12/21/20  1:48 PM  Result Value Ref Range   Sodium 137 135 - 145 mmol/L   Potassium 4.3 3.5 - 5.1 mmol/L   Chloride 108 98 - 111 mmol/L   CO2 22 22 - 32 mmol/L    Glucose, Bld 94 70 - 99 mg/dL   BUN 18 8 - 23 mg/dL   Creatinine, Ser 0.85 0.44 - 1.00 mg/dL   Calcium 9.5 8.9 - 10.3 mg/dL   GFR, Estimated >60 >60 mL/min   Anion gap 7 5 - 15  CBC     Status: None   Collection Time: 12/21/20  1:48 PM  Result Value Ref Range   WBC 6.3 4.0 - 10.5 K/uL   RBC 4.26 3.87 - 5.11 MIL/uL   Hemoglobin 13.4 12.0 - 15.0 g/dL   HCT 39.4 36.0 - 46.0 %   MCV 92.5 80.0 - 100.0 fL   MCH 31.5 26.0 - 34.0 pg   MCHC 34.0 30.0 - 36.0 g/dL   RDW 12.7 11.5 - 15.5 %   Platelets 231 150 - 400 K/uL   nRBC 0.0 0.0 - 0.2 %  Hepatic function panel     Status: Abnormal   Collection Time: 12/21/20  1:48 PM  Result Value Ref Range   Total Protein 6.9 6.5 - 8.1 g/dL   Albumin 3.8 3.5 - 5.0 g/dL   AST 14 (L) 15 - 41 U/L   ALT 13 0 - 44 U/L   Alkaline Phosphatase 63 38 - 126 U/L   Total Bilirubin 0.7 0.3 - 1.2 mg/dL   Bilirubin, Direct <0.1 0.0 - 0.2 mg/dL   Indirect Bilirubin NOT CALCULATED 0.3 - 0.9 mg/dL  Troponin I (High Sensitivity)     Status: None   Collection Time: 12/21/20  3:37 PM  Result Value Ref Range   Troponin I (High Sensitivity) 3 <18 ng/L  Urinalysis, Complete w Microscopic Nasopharyngeal Swab     Status: Abnormal   Collection Time: 12/21/20  4:34 PM  Result Value Ref Range   Color, Urine YELLOW (A) YELLOW   APPearance CLEAR (A) CLEAR   Specific Gravity, Urine 1.015 1.005 - 1.030   pH 5.0 5.0 - 8.0   Glucose, UA NEGATIVE NEGATIVE mg/dL   Hgb urine dipstick NEGATIVE NEGATIVE   Bilirubin Urine NEGATIVE NEGATIVE   Ketones, ur NEGATIVE NEGATIVE mg/dL   Protein, ur NEGATIVE NEGATIVE mg/dL   Nitrite NEGATIVE NEGATIVE   Leukocytes,Ua NEGATIVE NEGATIVE   RBC / HPF 0-5 0 - 5 RBC/hpf   WBC, UA 0-5 0 - 5 WBC/hpf   Bacteria, UA NONE SEEN NONE SEEN   Squamous Epithelial / LPF 0-5 0 - 5   Mucus PRESENT   Resp Panel by RT-PCR (Flu A&B, Covid) Nasopharyngeal Swab     Status: None   Collection Time: 12/21/20  4:34 PM   Specimen: Nasopharyngeal Swab;  Nasopharyngeal(NP) swabs in vial transport medium  Result Value Ref Range   SARS Coronavirus 2 by RT PCR NEGATIVE NEGATIVE   Influenza A by PCR NEGATIVE NEGATIVE   Influenza B by PCR NEGATIVE NEGATIVE   ____________________________________________  EKG My review and personal interpretation at Time: 13:49   Indication: dizziness  Rate: 60  Rhythm: sinus Axis: normal Other: normal intervals, no stemi  ____________________________________________  RADIOLOGY  I personally reviewed all radiographic images ordered to evaluate for the above acute complaints and reviewed radiology reports and findings.  These findings were personally discussed with the patient.  Please see medical record for radiology report.  ____________________________________________   PROCEDURES  Procedure(s) performed:  Procedures    Critical Care performed: no ____________________________________________   INITIAL IMPRESSION / ASSESSMENT AND PLAN / ED COURSE  Pertinent labs & imaging results that were available during my care of the patient were reviewed by me and considered in my medical decision making (see chart for details).   DDX: dehydration, enteritis, gastritis, vertigo, doubt acs, tia or cva  Ahriana Czarny Flood-Evans is a 65 y.o. who presents to the ED with presentation as described above patient clinically well-appearing in no acute distress.  Reassuring exam.  Suspect some dehydration given her history.  If she was able to provide stool sample will send off for stool studies.  Abdominal exam is soft benign with reassuring blood work.  Will give IVF and reassess.  Have a lower suspicion for cva or neuro etiology based on history and reassuring exam.  Clinical Course as of 12/21/20 1805  Fri Dec 21, 2020  1757 Patient reassessed. Does not feel she needs any more fluids.   Still feels a little bit dizzy when she stands up but over all improved.  Not orthostatic after IVF. Discussed my recommendation for  neuroimaging but patient declining this at this time.  Has benign hints exam.   States she would prefer to follow-up with PCP.  Will recheck orthostatics and if normal and patient feeling well seems reasonable for outpatient follow-up as she does not have any objective neurodeficits with reassuring exam and reassuring work-up. [PR]    Clinical Course User Index [PR] Merlyn Lot, MD    The patient was evaluated in Emergency Department today for the symptoms described in the history of present illness. He/she was evaluated in the context of the global COVID-19 pandemic, which necessitated consideration that the patient might be at risk for infection with the SARS-CoV-2 virus that causes COVID-19. Institutional protocols and algorithms that pertain to the evaluation of patients at risk for COVID-19 are in a state of rapid change based on information released by regulatory bodies including the CDC and federal and state organizations. These policies and algorithms were followed during the patient's care in the ED.  As part of my medical decision making, I reviewed the following data within the [place] notes reviewed and incorporated, Labs reviewed, notes from prior ED visits and  Controlled Substance Database   ____________________________________________   FINAL CLINICAL IMPRESSION(S) / ED DIAGNOSES  Final diagnoses:  Lightheadedness      NEW MEDICATIONS STARTED DURING THIS VISIT:  New Prescriptions   No medications on file     Note:  This document was prepared using Dragon voice recognition software and may include unintentional dictation errors.    Merlyn Lot, MD 12/21/20 [phone]

## 2021-08-02 ENCOUNTER — Telehealth: Payer: Self-pay | Admitting: Acute Care

## 2021-08-02 NOTE — Telephone Encounter (Signed)
Attempted to reach to schedule annual LDCT screening.  LVMM to call back. ?

## 2022-05-05 DIAGNOSIS — F32A Depression, unspecified: Secondary | ICD-10-CM | POA: Diagnosis not present

## 2022-05-05 DIAGNOSIS — Z1231 Encounter for screening mammogram for malignant neoplasm of breast: Secondary | ICD-10-CM | POA: Diagnosis not present

## 2022-05-05 DIAGNOSIS — Z0000 Encounter for general adult medical examination without abnormal findings: Secondary | ICD-10-CM | POA: Diagnosis not present

## 2022-05-05 DIAGNOSIS — J439 Emphysema, unspecified: Secondary | ICD-10-CM | POA: Diagnosis not present

## 2022-05-05 DIAGNOSIS — E538 Deficiency of other specified B group vitamins: Secondary | ICD-10-CM | POA: Diagnosis not present

## 2022-05-05 DIAGNOSIS — Z122 Encounter for screening for malignant neoplasm of respiratory organs: Secondary | ICD-10-CM | POA: Diagnosis not present

## 2022-05-05 DIAGNOSIS — Z780 Asymptomatic menopausal state: Secondary | ICD-10-CM | POA: Diagnosis not present

## 2022-05-05 DIAGNOSIS — Z720 Tobacco use: Secondary | ICD-10-CM | POA: Diagnosis not present

## 2022-05-08 IMAGING — CT CT CHEST LUNG CANCER SCREENING LOW DOSE W/O CM
2 of 5 series · 15 of 40 positions shown, 18 images · non-contrast
Comparison: Low-dose lung cancer screening CT chest dated
03/07/2019

CLINICAL DATA: 64-year-old female current smoker, with 45 pack-year
history of smoking, for follow-up lung cancer screening

EXAM:
CT CHEST WITHOUT CONTRAST LOW-DOSE FOR LUNG CANCER SCREENING
TECHNIQUE: Multidetector CT imaging of the chest was performed following the
standard protocol without IV contrast.

[Series 3: lung 1.00 · axial · 0.60mm/px · z∈[-1226,-896]mm · 12 of 363 slices shown, 15 images]
[im 17/363  mediastinal]
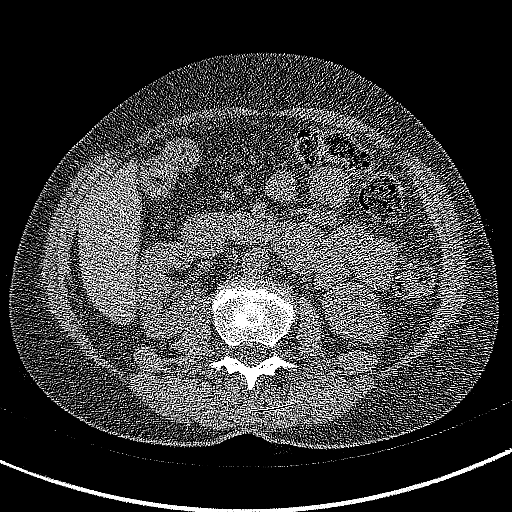
[im 17/363  lung]
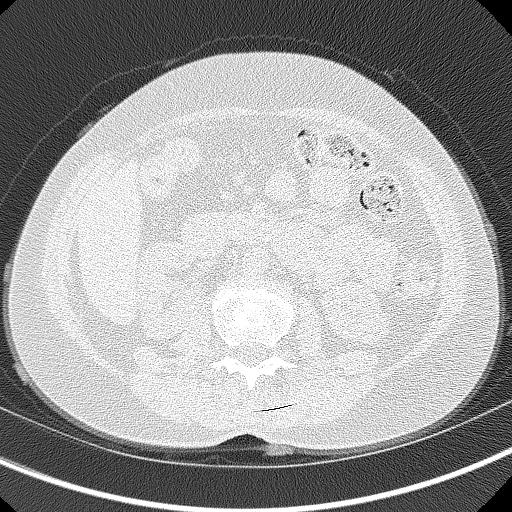
[im 50/363  lung]
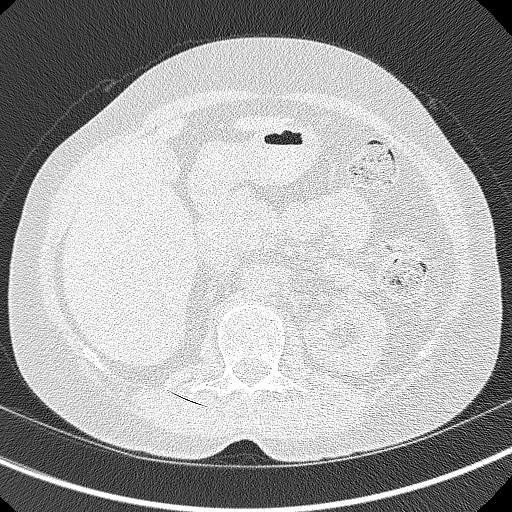
[im 83/363  lung]
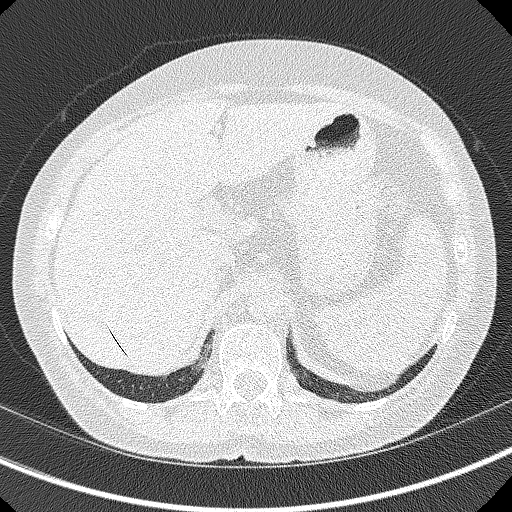
[im 116/363  lung]
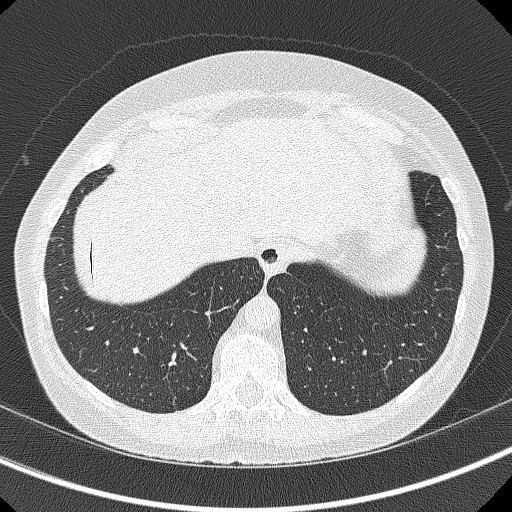
[im 132/363  mediastinal]
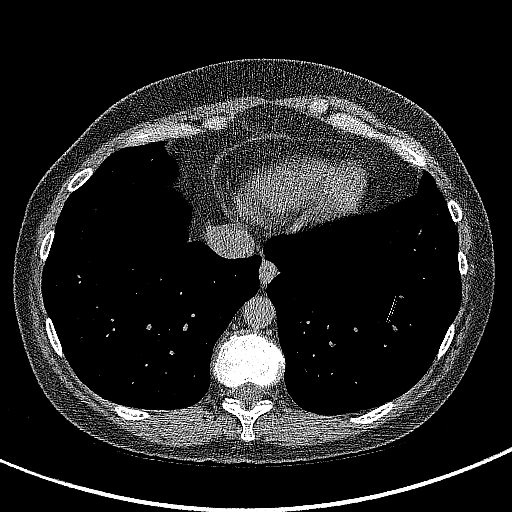
[im 132/363  lung]
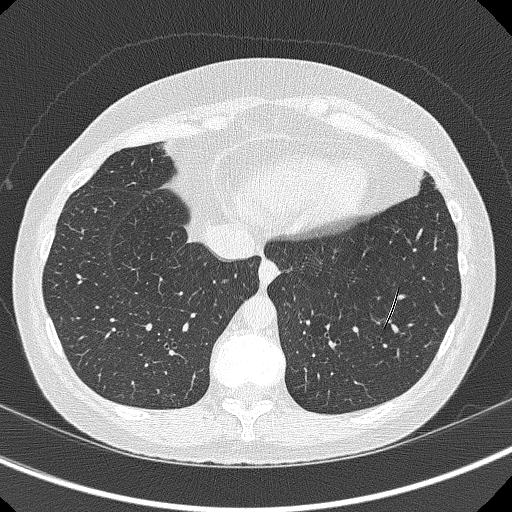
[im 165/363  lung]
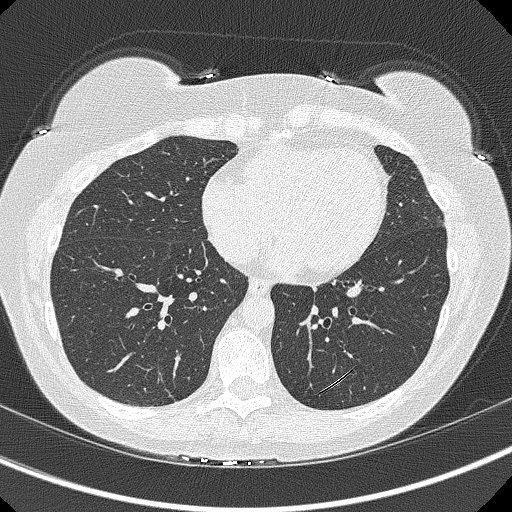
[im 198/363  lung]
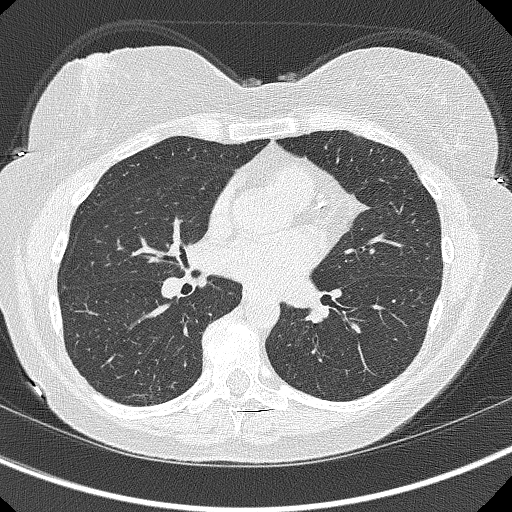
[im 231/363  lung]
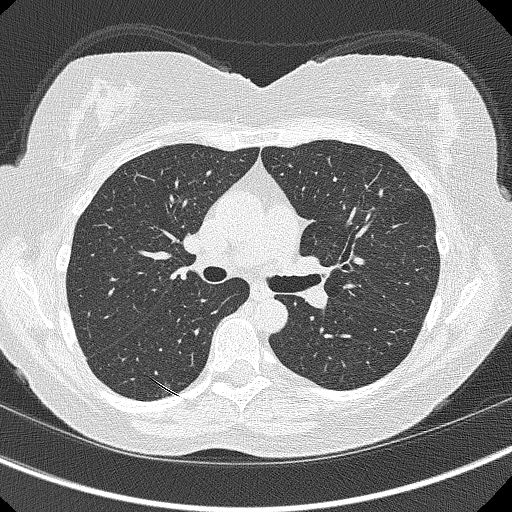
[im 247/363  mediastinal]
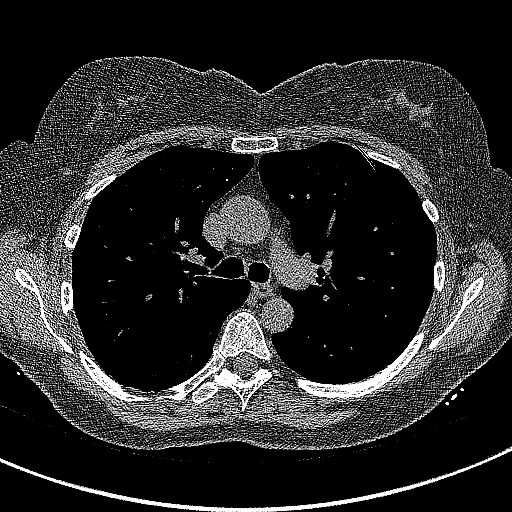
[im 247/363  lung]
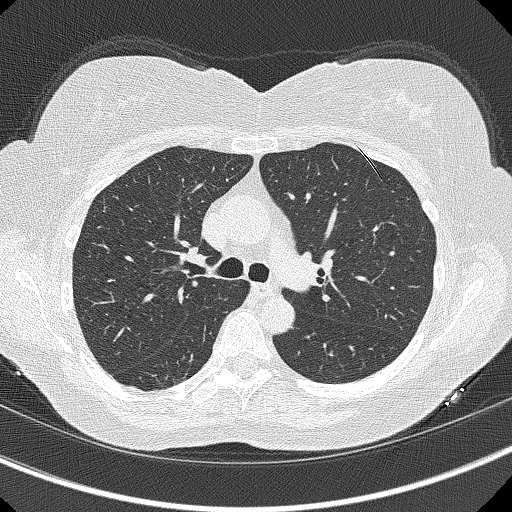
[im 280/363  lung]
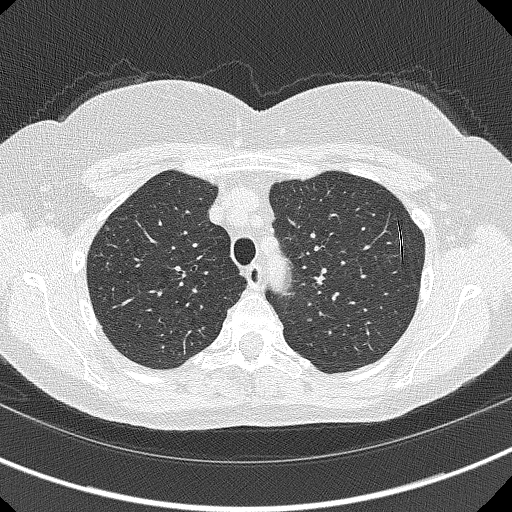
[im 313/363  lung]
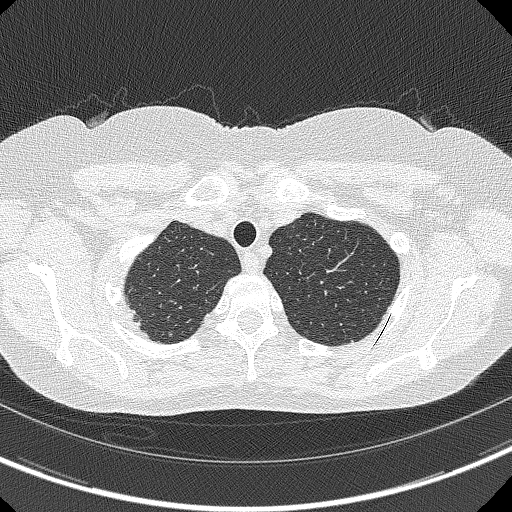
[im 346/363  lung]
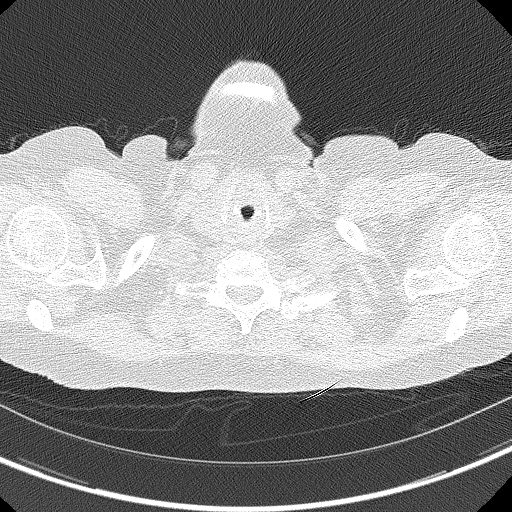

[Series 4: coronals lung 1.00 cor · coronal · 0.60mm/px · 3 of 249 slices shown]
[im 50/249  lung]
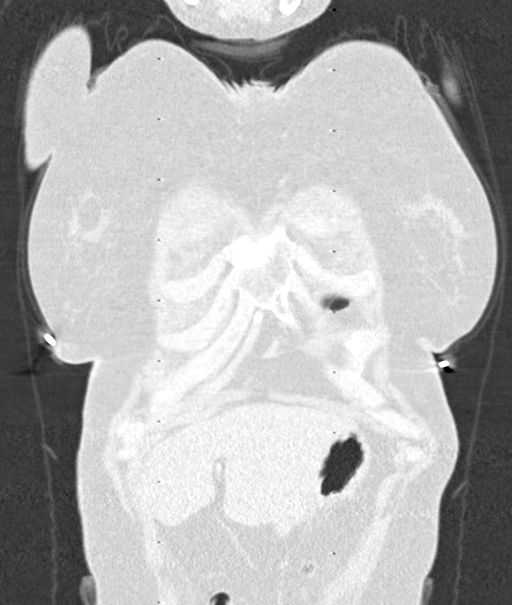
[im 100/249  lung]
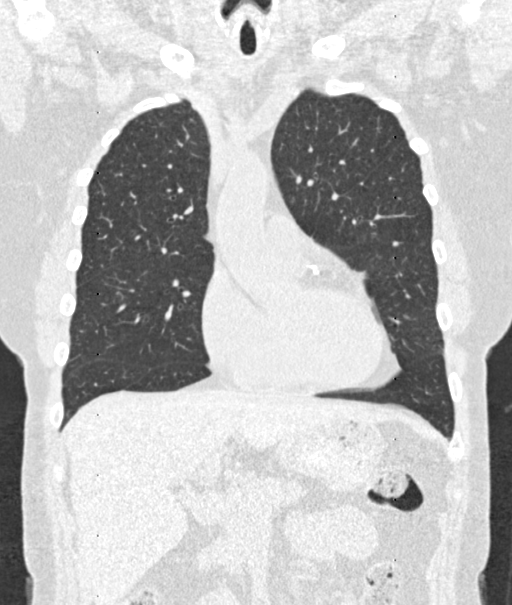
[im 149/249  lung]
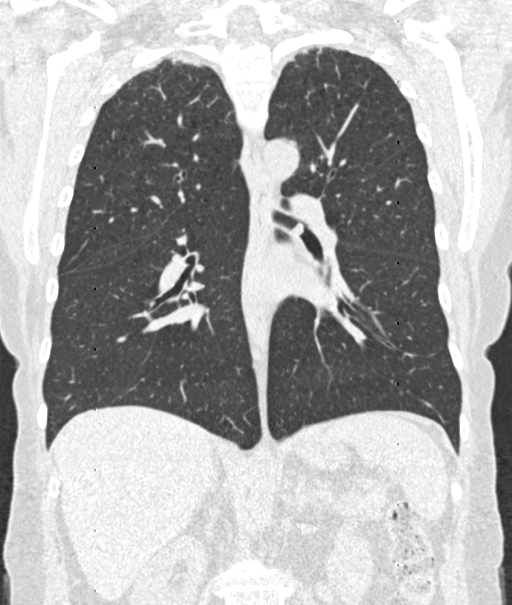

[15 of 40 positions shown; findings below may reference images not displayed]

FINDINGS: Cardiovascular: Heart is normal in size.  No pericardial effusion.

No evidence of thoracic aortic aneurysm. Atherosclerotic
calcifications of the aortic arch.

Coronary atherosclerosis of the LAD.

Mediastinum/Nodes: No suspicious mediastinal lymphadenopathy.

Visualized thyroid is unremarkable.

Lungs/Pleura: Mild biapical pleural-parenchymal scarring.

Mild centrilobular emphysematous changes, upper lung predominant.

No focal consolidation.

4.4 mm nodule in the superior segment right lower lobe, unchanged.

No pleural effusion or pneumothorax.

Upper Abdomen: Visualized upper abdomen is grossly unremarkable.

Musculoskeletal: Segmentation anomaly at T8.
IMPRESSION: Lung-RADS 2, benign appearance or behavior. Continue annual
screening with low-dose chest CT without contrast in 12 months.

Aortic Atherosclerosis (8YRKI-UZB.B) and Emphysema (8YRKI-IE1.4).

## 2022-05-09 NOTE — Telephone Encounter (Signed)
Letter mailed to home regarding annual LDCT is due

## 2022-05-12 DIAGNOSIS — E538 Deficiency of other specified B group vitamins: Secondary | ICD-10-CM | POA: Diagnosis not present

## 2022-05-12 DIAGNOSIS — M8588 Other specified disorders of bone density and structure, other site: Secondary | ICD-10-CM | POA: Diagnosis not present

## 2022-05-16 DIAGNOSIS — D485 Neoplasm of uncertain behavior of skin: Secondary | ICD-10-CM | POA: Diagnosis not present

## 2022-05-16 DIAGNOSIS — D2272 Melanocytic nevi of left lower limb, including hip: Secondary | ICD-10-CM | POA: Diagnosis not present

## 2022-05-16 DIAGNOSIS — L821 Other seborrheic keratosis: Secondary | ICD-10-CM | POA: Diagnosis not present

## 2022-05-16 DIAGNOSIS — L814 Other melanin hyperpigmentation: Secondary | ICD-10-CM | POA: Diagnosis not present

## 2022-05-16 DIAGNOSIS — D2261 Melanocytic nevi of right upper limb, including shoulder: Secondary | ICD-10-CM | POA: Diagnosis not present

## 2022-05-16 DIAGNOSIS — D2262 Melanocytic nevi of left upper limb, including shoulder: Secondary | ICD-10-CM | POA: Diagnosis not present

## 2022-05-16 DIAGNOSIS — D2271 Melanocytic nevi of right lower limb, including hip: Secondary | ICD-10-CM | POA: Diagnosis not present

## 2022-05-16 DIAGNOSIS — D225 Melanocytic nevi of trunk: Secondary | ICD-10-CM | POA: Diagnosis not present

## 2022-05-19 DIAGNOSIS — E538 Deficiency of other specified B group vitamins: Secondary | ICD-10-CM | POA: Diagnosis not present

## 2022-05-22 DIAGNOSIS — Z124 Encounter for screening for malignant neoplasm of cervix: Secondary | ICD-10-CM | POA: Diagnosis not present

## 2022-05-22 DIAGNOSIS — N941 Unspecified dyspareunia: Secondary | ICD-10-CM | POA: Diagnosis not present

## 2022-05-22 DIAGNOSIS — L900 Lichen sclerosus et atrophicus: Secondary | ICD-10-CM | POA: Diagnosis not present

## 2022-05-22 DIAGNOSIS — Z113 Encounter for screening for infections with a predominantly sexual mode of transmission: Secondary | ICD-10-CM | POA: Diagnosis not present

## 2022-05-22 DIAGNOSIS — N952 Postmenopausal atrophic vaginitis: Secondary | ICD-10-CM | POA: Diagnosis not present

## 2022-05-27 DIAGNOSIS — E538 Deficiency of other specified B group vitamins: Secondary | ICD-10-CM | POA: Diagnosis not present

## 2022-06-09 DIAGNOSIS — Z720 Tobacco use: Secondary | ICD-10-CM | POA: Diagnosis not present

## 2022-06-09 DIAGNOSIS — J209 Acute bronchitis, unspecified: Secondary | ICD-10-CM | POA: Diagnosis not present

## 2022-06-09 DIAGNOSIS — J440 Chronic obstructive pulmonary disease with acute lower respiratory infection: Secondary | ICD-10-CM | POA: Diagnosis not present

## 2022-07-25 ENCOUNTER — Other Ambulatory Visit: Payer: Self-pay | Admitting: Infectious Diseases

## 2022-07-25 ENCOUNTER — Other Ambulatory Visit: Payer: Self-pay

## 2022-07-25 DIAGNOSIS — Z1231 Encounter for screening mammogram for malignant neoplasm of breast: Secondary | ICD-10-CM

## 2022-07-25 DIAGNOSIS — F17210 Nicotine dependence, cigarettes, uncomplicated: Secondary | ICD-10-CM

## 2022-07-25 DIAGNOSIS — Z87891 Personal history of nicotine dependence: Secondary | ICD-10-CM

## 2022-08-11 ENCOUNTER — Ambulatory Visit
Admission: RE | Admit: 2022-08-11 | Discharge: 2022-08-11 | Disposition: A | Discharge status: Home / Self Care | Payer: Medicare PPO | Source: Ambulatory Visit | Attending: Acute Care | Admitting: Acute Care

## 2022-08-11 DIAGNOSIS — F17210 Nicotine dependence, cigarettes, uncomplicated: Secondary | ICD-10-CM | POA: Insufficient documentation

## 2022-08-11 DIAGNOSIS — Z87891 Personal history of nicotine dependence: Secondary | ICD-10-CM | POA: Insufficient documentation

## 2022-08-13 ENCOUNTER — Telehealth: Payer: Self-pay | Admitting: *Deleted

## 2022-08-13 DIAGNOSIS — R911 Solitary pulmonary nodule: Secondary | ICD-10-CM

## 2022-08-13 DIAGNOSIS — Z87891 Personal history of nicotine dependence: Secondary | ICD-10-CM

## 2022-08-13 NOTE — Telephone Encounter (Signed)
Left message for pt to call back to discuss lung screening CT results. 

## 2022-08-13 NOTE — Telephone Encounter (Signed)
Spoke with pt and reviewed lung screening CT results. PT advised that she has one new small nodule seen that we will look at again in 6 months with a repeat CT scan.  Mild coronary calcifications noted.  Results sent to PCP with follow up plans included.  Order placed for 6 month nodule f/u Ct.

## 2022-08-18 ENCOUNTER — Ambulatory Visit
Admission: RE | Admit: 2022-08-18 | Discharge: 2022-08-18 | Disposition: A | Discharge status: Home / Self Care | Payer: Medicare PPO | Source: Ambulatory Visit | Attending: Infectious Diseases | Admitting: Infectious Diseases

## 2022-08-18 DIAGNOSIS — Z1231 Encounter for screening mammogram for malignant neoplasm of breast: Secondary | ICD-10-CM

## 2023-02-09 IMAGING — CR DG CHEST 2V
2 series · 2 of 2 positions shown · non-contrast
Comparison: May 30, 2018

CLINICAL DATA: chest pain, weakness

EXAM:
CHEST - 2 VIEW

[chest pa]
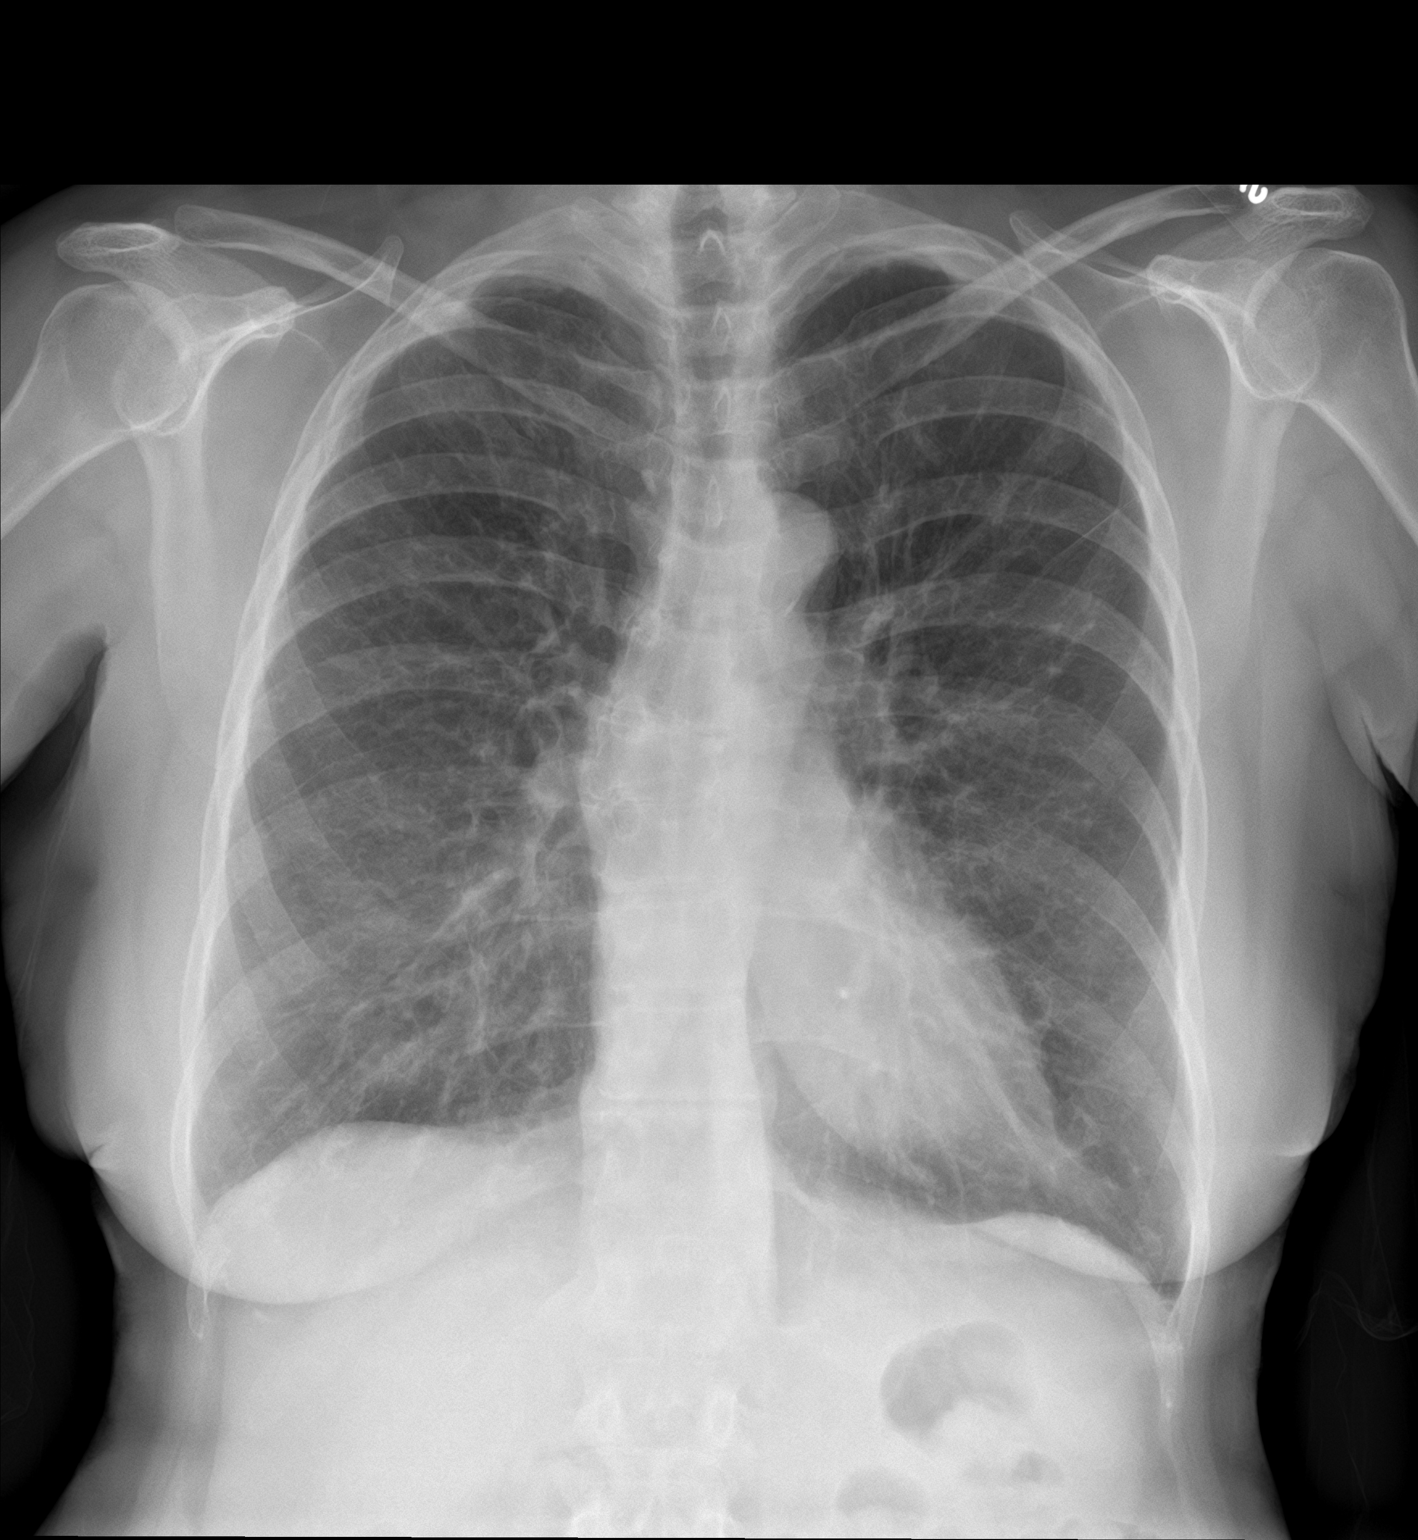

[chest lat]
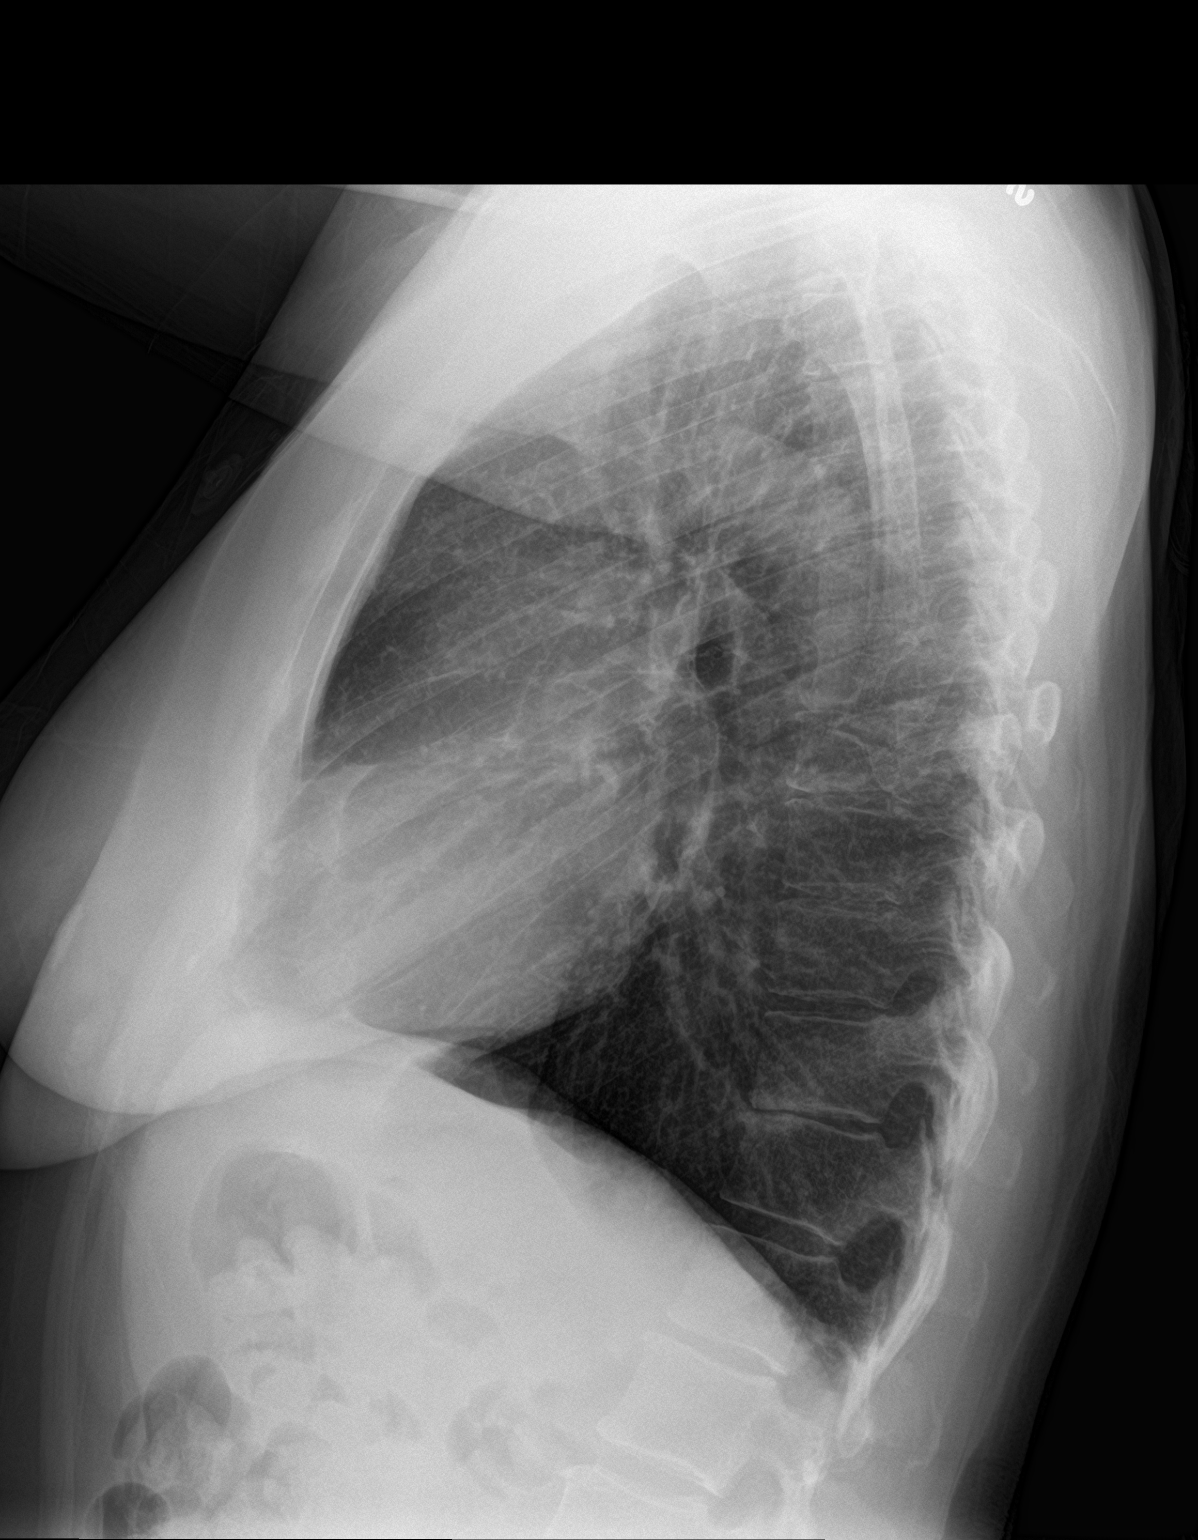

[2 of 2 positions shown; findings below may reference images not displayed]

FINDINGS: The cardiomediastinal silhouette is unchanged in contour.Biapical
scarring, unchanged no pleural effusion. No pneumothorax. No acute
pleuroparenchymal abnormality. Visualized abdomen is unremarkable.
Mild multilevel degenerative changes of the thoracic spine.
IMPRESSION: No acute cardiopulmonary abnormality.

## 2023-02-11 ENCOUNTER — Ambulatory Visit
Admission: RE | Admit: 2023-02-11 | Discharge: 2023-02-11 | Disposition: A | Discharge status: Home / Self Care | Payer: Medicare PPO | Source: Ambulatory Visit | Attending: Acute Care | Admitting: Acute Care

## 2023-02-11 DIAGNOSIS — R911 Solitary pulmonary nodule: Secondary | ICD-10-CM | POA: Insufficient documentation

## 2023-02-11 DIAGNOSIS — Z87891 Personal history of nicotine dependence: Secondary | ICD-10-CM | POA: Insufficient documentation

## 2023-02-25 ENCOUNTER — Other Ambulatory Visit: Payer: Self-pay | Admitting: Acute Care

## 2023-02-25 DIAGNOSIS — Z122 Encounter for screening for malignant neoplasm of respiratory organs: Secondary | ICD-10-CM

## 2023-02-25 DIAGNOSIS — Z87891 Personal history of nicotine dependence: Secondary | ICD-10-CM

## 2023-02-25 DIAGNOSIS — F17210 Nicotine dependence, cigarettes, uncomplicated: Secondary | ICD-10-CM

## 2023-03-16 ENCOUNTER — Ambulatory Visit: Payer: Medicare PPO

## 2023-03-16 DIAGNOSIS — Z860101 Personal history of adenomatous and serrated colon polyps: Secondary | ICD-10-CM | POA: Diagnosis not present

## 2023-03-16 DIAGNOSIS — Z09 Encounter for follow-up examination after completed treatment for conditions other than malignant neoplasm: Secondary | ICD-10-CM | POA: Diagnosis not present

## 2023-12-23 ENCOUNTER — Other Ambulatory Visit: Payer: Self-pay | Admitting: Infectious Diseases

## 2023-12-23 DIAGNOSIS — Z1231 Encounter for screening mammogram for malignant neoplasm of breast: Secondary | ICD-10-CM

## 2024-01-07 ENCOUNTER — Encounter

## 2024-01-25 ENCOUNTER — Ambulatory Visit
Admission: RE | Admit: 2024-01-25 | Discharge: 2024-01-25 | Disposition: A | Discharge status: Home / Self Care | Source: Ambulatory Visit | Attending: Infectious Diseases | Admitting: Infectious Diseases

## 2024-01-25 DIAGNOSIS — Z1231 Encounter for screening mammogram for malignant neoplasm of breast: Secondary | ICD-10-CM | POA: Diagnosis present

## 2024-02-12 ENCOUNTER — Ambulatory Visit
Admission: RE | Admit: 2024-02-12 | Discharge: 2024-02-12 | Disposition: A | Discharge status: Home / Self Care | Source: Ambulatory Visit | Attending: Acute Care | Admitting: Acute Care

## 2024-02-12 DIAGNOSIS — Z87891 Personal history of nicotine dependence: Secondary | ICD-10-CM | POA: Insufficient documentation

## 2024-02-12 DIAGNOSIS — F17210 Nicotine dependence, cigarettes, uncomplicated: Secondary | ICD-10-CM | POA: Insufficient documentation

## 2024-02-12 DIAGNOSIS — Z122 Encounter for screening for malignant neoplasm of respiratory organs: Secondary | ICD-10-CM | POA: Insufficient documentation

## 2024-02-23 ENCOUNTER — Other Ambulatory Visit: Payer: Self-pay

## 2024-02-23 DIAGNOSIS — Z122 Encounter for screening for malignant neoplasm of respiratory organs: Secondary | ICD-10-CM

## 2024-02-23 DIAGNOSIS — Z87891 Personal history of nicotine dependence: Secondary | ICD-10-CM

## 2024-02-23 DIAGNOSIS — F17210 Nicotine dependence, cigarettes, uncomplicated: Secondary | ICD-10-CM

## 2024-06-09 ENCOUNTER — Other Ambulatory Visit: Payer: Self-pay

## 2024-06-09 ENCOUNTER — Emergency Department
Admission: EM | Admit: 2024-06-09 | Discharge: 2024-06-09 | Disposition: A | Discharge status: Home / Self Care | Attending: Emergency Medicine | Admitting: Emergency Medicine

## 2024-06-09 ENCOUNTER — Emergency Department

## 2024-06-09 DIAGNOSIS — H01004 Unspecified blepharitis left upper eyelid: Secondary | ICD-10-CM | POA: Insufficient documentation

## 2024-06-09 DIAGNOSIS — R058 Other specified cough: Secondary | ICD-10-CM | POA: Diagnosis not present

## 2024-06-09 DIAGNOSIS — H5712 Ocular pain, left eye: Secondary | ICD-10-CM | POA: Diagnosis present

## 2024-06-09 MED ORDER — BENZONATATE 100 MG PO CAPS: 100.0000 mg | ORAL_CAPSULE | Freq: Three times a day (TID) | ORAL | 0 refills | Status: AC | PRN

## 2024-06-09 MED ORDER — GUAIFENESIN-CODEINE 100-10 MG/5ML PO SOLN: 10.0000 mL | Freq: Four times a day (QID) | ORAL | 0 refills | Status: AC | PRN

## 2024-06-09 NOTE — ED Provider Notes (Signed)
 "  [facility] Provider Note    Event Date/Time   First MD Initiated Contact with Patient 06/09/24 0815     (approximate)   History   Eye Pain   HPI  Jillian Greene is a 69 y.o. female with PMH of anxiety, depression and GERD presents for evaluation of left eye pain that began over the weekend.  Patient endorses some redness and swelling to the upper eyelid.  She denies drainage, light sensitivity.  She states she has had occasional blurry vision.  No trauma to the eye.  Patient also reports that she has had cough and bodyaches since around Christmas time.      Physical Exam   Triage Vital Signs: ED Triage Vitals [06/09/24 0810]  Encounter Vitals Group     BP 135/77     Girls Systolic BP Percentile      Girls Diastolic BP Percentile      Boys Systolic BP Percentile      Boys Diastolic BP Percentile      Pulse Rate 66     Resp 18     Temp 98.6 F (37 C)     Temp src      SpO2 98 %     Weight 122 lb (55.3 kg)     Height 5' 2.5 (1.588 m)     Head Circumference      Peak Flow      Pain Score 0     Pain Loc      Pain Education      Exclude from Growth Chart     Most recent vital signs: Vitals:   06/09/24 0810 06/09/24 0816  BP: 135/77   Pulse: 66   Resp: 18   Temp: 98.6 F (37 C)   SpO2: 98% 98%   General: Awake, no distress.  CV:  Good peripheral perfusion.  RRR. Resp:  Normal effort.  CTAB. Abd:  No distention.  Other:  Swelling and erythema to the left upper eyelid just along the lash line, no conjunctival injection, PERRL, EOM intact   ED Results / Procedures / Treatments   Labs (all labs ordered are listed, but only abnormal results are displayed) Labs Reviewed - No data to display   EKG  RADIOLOGY  Chest x-ray obtained, I interpreted the images as well as reviewed the radiologist report, which was negative for any acute cardiopulmonary abnormalities.  PROCEDURES:  Critical Care performed:  No  Procedures   MEDICATIONS ORDERED IN ED: Medications - No data to display   IMPRESSION / MDM / ASSESSMENT AND PLAN / ED COURSE  I reviewed the triage vital signs and the nursing notes.                             69 year old female presents for evaluation of left eyelid swelling.  Vital signs are stable patient NAD on exam.  Differential diagnosis includes, but is not limited to, blepharitis, chalazion, stye, viral URI, pneumonia.  Patient's presentation is most consistent with acute complicated illness / injury requiring diagnostic workup.  Patient's eyelid swelling is local to just along the lash line consistent with blepharitis.  Will recommend treatment with warm compresses, gentle shampoo and lid massage.  Will give patient follow-up with ophthalmology should her symptoms continue.  In regards to patient's persistent cough, will get a chest x-ray to rule out pneumonia.  If negative will plan to treat symptomatically.  Chest x-ray  is negative for pneumonia.  I believe this is a postviral cough.  Will send in a prescription for some cough medicine.  Patient did not need a note for work.  She voiced understanding, questions were answered and she was stable at discharge.     FINAL CLINICAL IMPRESSION(S) / ED DIAGNOSES   Final diagnoses:  Blepharitis of left upper eyelid, unspecified type  Post-viral cough syndrome     Rx / DC Orders   ED Discharge Orders          Ordered    benzonatate  (TESSALON ) 100 MG capsule  3 times daily PRN        06/09/24 0920    guaiFENesin -codeine  100-10 MG/5ML syrup  Every 6 hours PRN        06/09/24 0920             Note:  This document was prepared using Dragon voice recognition software and may include unintentional dictation errors.   Cleaster Tinnie LABOR, PA-C 06/09/24 [id number]    Ernest Ronal BRAVO, MD 06/10/24 1524  "

## 2024-06-09 NOTE — Discharge Instructions (Addendum)
 Your eye swelling is due to blepharitis which is inflammation of the eyelid.  I have attached instructions on treatment for this.  Please use warm compresses at least twice a day and wash with a gentle baby shampoo.  I have attached information for ophthalmology who you can schedule follow-up appointment with if your symptoms continue.  You can also use over-the-counter eyedrops for dry eyes.  I have sent 2 cough medicines to the pharmacy.  The Tessalon  can be taken throughout the day.  The cough syrup should only be taken at night as you cannot drive after taking it.

## 2024-06-09 NOTE — ED Triage Notes (Signed)
 Pt comes with left eye pain that started over the weekend. Pt states she had been sick with body aches and cough. Pt states swelling and redness to area.
